# Patient Record
Sex: Male | Born: 1955 | Race: White | Hispanic: No | Marital: Married | Smoking: Former smoker
Health system: Southern US, Community
[De-identification: ages and names within clinical notes are randomized; demographics above are authoritative.]

## PROBLEM LIST (undated history)

## (undated) DIAGNOSIS — C33 Malignant neoplasm of trachea: Secondary | ICD-10-CM

## (undated) DIAGNOSIS — I2510 Atherosclerotic heart disease of native coronary artery without angina pectoris: Secondary | ICD-10-CM

## (undated) DIAGNOSIS — E039 Hypothyroidism, unspecified: Secondary | ICD-10-CM

## (undated) DIAGNOSIS — E079 Disorder of thyroid, unspecified: Secondary | ICD-10-CM

## (undated) DIAGNOSIS — R627 Adult failure to thrive: Secondary | ICD-10-CM

## (undated) DIAGNOSIS — Z9289 Personal history of other medical treatment: Secondary | ICD-10-CM

## (undated) DIAGNOSIS — E785 Hyperlipidemia, unspecified: Secondary | ICD-10-CM

## (undated) HISTORY — DX: Atherosclerotic heart disease of native coronary artery without angina pectoris: I25.10

## (undated) HISTORY — DX: Hypothyroidism, unspecified: E03.9

## (undated) HISTORY — PX: GASTROSTOMY TUBE PLACEMENT: SHX655

## (undated) HISTORY — DX: Personal history of other medical treatment: Z92.89

## (undated) HISTORY — DX: Hyperlipidemia, unspecified: E78.5

---

## 2006-04-02 ENCOUNTER — Emergency Department: Payer: Self-pay | Admitting: Emergency Medicine

## 2006-04-09 ENCOUNTER — Emergency Department: Payer: Self-pay | Admitting: Emergency Medicine

## 2006-07-28 ENCOUNTER — Emergency Department: Payer: Self-pay | Admitting: Emergency Medicine

## 2012-12-01 ENCOUNTER — Emergency Department: Payer: Self-pay | Admitting: Emergency Medicine

## 2013-08-06 ENCOUNTER — Emergency Department: Payer: Self-pay | Admitting: Emergency Medicine

## 2013-08-06 LAB — COMPREHENSIVE METABOLIC PANEL
ALT: 37 U/L (ref 12–78)
ANION GAP: 7 (ref 7–16)
Albumin: 4 g/dL (ref 3.4–5.0)
Alkaline Phosphatase: 108 U/L
BUN: 20 mg/dL — ABNORMAL HIGH (ref 7–18)
Bilirubin,Total: 0.5 mg/dL (ref 0.2–1.0)
Calcium, Total: 8.4 mg/dL — ABNORMAL LOW (ref 8.5–10.1)
Chloride: 106 mmol/L (ref 98–107)
Co2: 28 mmol/L (ref 21–32)
Creatinine: 1.4 mg/dL — ABNORMAL HIGH (ref 0.60–1.30)
GFR CALC NON AF AMER: 55 — AB
GLUCOSE: 117 mg/dL — AB (ref 65–99)
OSMOLALITY: 285 (ref 275–301)
Potassium: 3.9 mmol/L (ref 3.5–5.1)
SGOT(AST): 30 U/L (ref 15–37)
SODIUM: 141 mmol/L (ref 136–145)
Total Protein: 7.1 g/dL (ref 6.4–8.2)

## 2013-08-06 LAB — CBC
HCT: 46.1 % (ref 40.0–52.0)
HGB: 15.7 g/dL (ref 13.0–18.0)
MCH: 33.7 pg (ref 26.0–34.0)
MCHC: 34.1 g/dL (ref 32.0–36.0)
MCV: 99 fL (ref 80–100)
PLATELETS: 259 10*3/uL (ref 150–440)
RBC: 4.66 10*6/uL (ref 4.40–5.90)
RDW: 13.8 % (ref 11.5–14.5)
WBC: 16.3 10*3/uL — ABNORMAL HIGH (ref 3.8–10.6)

## 2013-08-06 LAB — URINALYSIS, COMPLETE
Bacteria: NONE SEEN
Bilirubin,UR: NEGATIVE
Glucose,UR: NEGATIVE mg/dL (ref 0–75)
Ketone: NEGATIVE
LEUKOCYTE ESTERASE: NEGATIVE
Nitrite: NEGATIVE
Ph: 5 (ref 4.5–8.0)
Protein: 30
SPECIFIC GRAVITY: 1.032 (ref 1.003–1.030)
Squamous Epithelial: NONE SEEN
WBC UR: 2 /HPF (ref 0–5)

## 2015-10-19 ENCOUNTER — Encounter: Payer: Self-pay | Admitting: Emergency Medicine

## 2015-10-19 ENCOUNTER — Emergency Department
Admission: EM | Admit: 2015-10-19 | Discharge: 2015-10-19 | Disposition: A | Discharge status: Home / Self Care | Payer: Self-pay | Attending: Emergency Medicine | Admitting: Emergency Medicine

## 2015-10-19 ENCOUNTER — Emergency Department: Payer: Self-pay

## 2015-10-19 DIAGNOSIS — R252 Cramp and spasm: Secondary | ICD-10-CM | POA: Insufficient documentation

## 2015-10-19 DIAGNOSIS — F17200 Nicotine dependence, unspecified, uncomplicated: Secondary | ICD-10-CM | POA: Insufficient documentation

## 2015-10-19 LAB — BASIC METABOLIC PANEL
Anion gap: 7 (ref 5–15)
BUN: 30 mg/dL — AB (ref 6–20)
CALCIUM: 8.9 mg/dL (ref 8.9–10.3)
CO2: 26 mmol/L (ref 22–32)
CREATININE: 0.85 mg/dL (ref 0.61–1.24)
Chloride: 105 mmol/L (ref 101–111)
GFR calc non Af Amer: >60 mL/min (ref >60)
Glucose, Bld: 140 mg/dL — ABNORMAL HIGH (ref 65–99)
Potassium: 3.8 mmol/L (ref 3.5–5.1)
SODIUM: 138 mmol/L (ref 135–145)

## 2015-10-19 LAB — CBC WITH DIFFERENTIAL/PLATELET
Basophils Absolute: 0.1 10*3/uL (ref 0–0.1)
Basophils Relative: 1 %
Eosinophils Absolute: 0.2 10*3/uL (ref 0–0.7)
Eosinophils Relative: 2 %
HEMATOCRIT: 44.4 % (ref 40.0–52.0)
HEMOGLOBIN: 15.3 g/dL (ref 13.0–18.0)
LYMPHS ABS: 2 10*3/uL (ref 1.0–3.6)
MCH: 33.6 pg (ref 26.0–34.0)
MCHC: 34.5 g/dL (ref 32.0–36.0)
MCV: 97.4 fL (ref 80.0–100.0)
Monocytes Absolute: 0.7 10*3/uL (ref 0.2–1.0)
NEUTROS ABS: 6.8 10*3/uL — AB (ref 1.4–6.5)
Platelets: 292 10*3/uL (ref 150–440)
RBC: 4.56 MIL/uL (ref 4.40–5.90)
RDW: 13.5 % (ref 11.5–14.5)
WBC: 9.7 10*3/uL (ref 3.8–10.6)

## 2015-10-19 MED ORDER — NAPROXEN 500 MG PO TABS: 500.0000 mg | ORAL_TABLET | Freq: Two times a day (BID) | ORAL | Status: DC

## 2015-10-19 MED ORDER — CYCLOBENZAPRINE HCL 10 MG PO TABS: 10.0000 mg | ORAL_TABLET | Freq: Three times a day (TID) | ORAL | Status: DC | PRN

## 2015-10-19 NOTE — Discharge Instructions (Signed)
Muscle Cramps and Spasms Muscle cramps and spasms occur when a muscle or muscles tighten and you have no control over this tightening (involuntary muscle contraction). They are a common problem and can develop in any muscle. The most common place is in the calf muscles of the leg. Both muscle cramps and muscle spasms are involuntary muscle contractions, but they also have differences:   Muscle cramps are sporadic and painful. They may last a few seconds to a quarter of an hour. Muscle cramps are often more forceful and last longer than muscle spasms.  Muscle spasms may or may not be painful. They may also last just a few seconds or much longer. CAUSES  It is uncommon for cramps or spasms to be due to a serious underlying problem. In many cases, the cause of cramps or spasms is unknown. Some common causes are:   Overexertion.   Overuse from repetitive motions (doing the same thing over and over).   Remaining in a certain position for a long period of time.   Improper preparation, form, or technique while performing a sport or activity.   Dehydration.   Injury.   Side effects of some medicines.   Abnormally low levels of the salts and ions in your blood (electrolytes), especially potassium and calcium. This could happen if you are taking water pills (diuretics) or you are pregnant.  Some underlying medical problems can make it more likely to develop cramps or spasms. These include, but are not limited to:   Diabetes.   Parkinson disease.   Hormone disorders, such as thyroid problems.   Alcohol abuse.   Diseases specific to muscles, joints, and bones.   Blood vessel disease where not enough blood is getting to the muscles.  HOME CARE INSTRUCTIONS   Stay well hydrated. Drink enough water and fluids to keep your urine clear or pale yellow.  It may be helpful to massage, stretch, and relax the affected muscle.  For tight or tense muscles, use a warm towel, heating  pad, or hot shower water directed to the affected area.  If you are sore or have pain after a cramp or spasm, applying ice to the affected area may relieve discomfort.  Put ice in a plastic bag.  Place a towel between your skin and the bag.  Leave the ice on for 15-20 minutes, 03-04 times a day.  Medicines used to treat a known cause of cramps or spasms may help reduce their frequency or severity. Only take over-the-counter or prescription medicines as directed by your caregiver. SEEK MEDICAL CARE IF:  Your cramps or spasms get more severe, more frequent, or do not improve over time.  MAKE SURE YOU:   Understand these instructions.  Will watch your condition.  Will get help right away if you are not doing well or get worse.   This information is not intended to replace advice given to you by your health care provider. Make sure you discuss any questions you have with your health care provider.   Document Released: 11/11/2001 Document Revised: 09/16/2012 Document Reviewed: 05/08/2012 Elsevier Interactive Patient Education 2016 [employer]  Leg Cramps Leg cramps occur when a muscle or muscles tighten and you have no control over this tightening (involuntary muscle contraction). Muscle cramps can develop in any muscle, but the most common place is in the calf muscles of the leg. Those cramps can occur during exercise or when you are at rest. Leg cramps are painful, and they may last for a  few seconds to a few minutes. Cramps may return several times before they finally stop. Usually, leg cramps are not caused by a serious medical problem. In many cases, the cause is not known. Some common causes include:  Overexertion.  Overuse from repetitive motions, or doing the same thing over and over.  Remaining in a certain position for a long period of time.  Improper preparation, form, or technique while performing a sport or an activity.  Dehydration.  Injury.  Side effects of some  medicines.  Abnormally low levels of the salts and ions in your blood (electrolytes), especially potassium and calcium. These levels could be low if you are taking water pills (diuretics) or if you are pregnant. HOME CARE INSTRUCTIONS Watch your condition for any changes. Taking the following actions may help to lessen any discomfort that you are feeling:  Stay well-hydrated. Drink enough fluid to keep your urine clear or pale yellow.  Try massaging, stretching, and relaxing the affected muscle. Do this for several minutes at a time.  For tight or tense muscles, use a warm towel, heating pad, or hot shower water directed to the affected area.  If you are sore or have pain after a cramp, applying ice to the affected area may relieve discomfort.  Put ice in a plastic bag.  Place a towel between your skin and the bag.  Leave the ice on for 20 minutes, 2-3 times per day.  Avoid strenuous exercise for several days if you have been having frequent leg cramps.  Make sure that your diet includes the essential minerals for your muscles to work normally.  Take medicines only as directed by your health care provider. SEEK MEDICAL CARE IF:  Your leg cramps get more severe or more frequent, or they do not improve over time.  Your foot becomes cold, numb, or blue.   This information is not intended to replace advice given to you by your health care provider. Make sure you discuss any questions you have with your health care provider.   Document Released: 06/29/2004 Document Revised: 10/06/2014 Document Reviewed: 04/29/2014 Elsevier Interactive Patient Education [employer].

## 2015-10-19 NOTE — ED Notes (Addendum)
Pt to ed with c/o left knee and lower leg pain x 1 1/2 weeks.  Pt states cramping last week in left lower leg.  Pt reports increased pain at night. No swelling noted to lower leg.

## 2015-10-19 NOTE — ED Notes (Signed)
See triage note   Denies any injury  But has been having pain for about 1-2 weeks   Pain increases at night

## 2015-10-19 NOTE — ED Provider Notes (Signed)
[facility] Emergency Department Provider Note  ____________________________________________  Time seen: Approximately 2:52 PM  I have reviewed the triage vital signs and the nursing notes.   HISTORY  Chief Complaint Knee Pain    HPI Matthew Peters is a 60 y.o. male , NAD, presents emergency Department with 2 week history of left lower leg pain. States he gets cramps at night which seemed to worsen his pain. Cannot pinpoint any specific place in which she feels pain. Pain is from his left knee down through his left ankle. Has not noted any redness, swelling, warmth. No skin sores or open wounds. Denies any injuries, traumas, falls. Has not had any numbness, weakness, tingling. Has taken a couple of aspirin for the pain but states it does not help. Denies chest pain, shortness of breath, wheezing, visual changes nor headaches.   History reviewed. No pertinent past medical history.  There are no active problems to display for this patient.   History reviewed. No pertinent past surgical history.  Current Outpatient Rx  Name  Route  Sig  Dispense  Refill  . cyclobenzaprine (FLEXERIL) 10 MG tablet   Oral   Take 1 tablet (10 mg total) by mouth 3 (three) times daily as needed for muscle spasms.   21 tablet   0   . naproxen (NAPROSYN) 500 MG tablet   Oral   Take 1 tablet (500 mg total) by mouth 2 (two) times daily with a meal.   14 tablet   0     Allergies Sulfa antibiotics  History reviewed. No pertinent family history.  Social History Social History  Substance Use Topics  . Smoking status: Current Every Day Smoker  . Smokeless tobacco: None  . Alcohol Use: No     Review of Systems  Constitutional: No fever/chills, fatigue Eyes: No visual changes. Cardiovascular: No chest pain, palpitations. Respiratory: No shortness of breath. No wheezing.  Gastrointestinal: No abdominal pain.  No nausea, vomiting.  Musculoskeletal: Positive left knee pain  through the left ankle. Negative for back, hip pain.  Skin: Negative for rash, skin sores, swelling, redness, warmth. Neurological: Negative for headaches, focal weakness or numbness. No tingling. No dizziness. 10-point ROS otherwise negative.  ____________________________________________   PHYSICAL EXAM:  VITAL SIGNS: ED Triage Vitals  Enc Vitals Group     BP 10/19/15 1325 158/93 mmHg     Pulse Rate 10/19/15 1325 107     Resp 10/19/15 1325 20     Temp 10/19/15 1325 97.6 F (36.4 C)     Temp Source 10/19/15 1325 Oral     SpO2 10/19/15 1325 95 %     Weight 10/19/15 1325 180 lb (81.647 kg)     Height 10/19/15 1325 5\' 5"  (1.651 m)     Head Cir --      Peak Flow --      Pain Score 10/19/15 1326 4     Pain Loc --      Pain Edu? --      Excl. in [place]? --      Constitutional: Alert and oriented. Well appearing and in no acute distress. Eyes: Conjunctivae are normal.  Head: Atraumatic. Neck: Supple with full range of motion. Cardiovascular: Normal rate, regular rhythm. Normal S1 and S2.  Good peripheral circulation with 2+ pulses noted in the left lower extremity. Capillary refill is brisk in the left lower extremity. Respiratory: Normal respiratory effort without tachypnea or retractions. Lungs CTAB with breath sounds noted in all lung  fields. Musculoskeletal: Full range of motion of the left knee, ankle, foot, toes without pain. No lower extremity tenderness nor edema.  No joint effusions. Neurologic:  Normal speech and language. No gross focal neurologic deficits are appreciated. Gait and posture is normal. CN III-XII grossly in tact. Sensation to light touch is grossly normal in the left lower extremity. Skin:  Skin is warm, dry and intact. No rash, redness, warmth, skin sores, open wounds noted. Psychiatric: Mood and affect are normal. Speech and behavior are normal. Patient exhibits appropriate insight and judgement.   ____________________________________________   LABS (all  labs ordered are listed, but only abnormal results are displayed)  Labs Reviewed  BASIC METABOLIC PANEL - Abnormal; Notable for the following:    Glucose, Bld 140 (*)    BUN 30 (*)    All other components within normal limits  CBC WITH DIFFERENTIAL/PLATELET - Abnormal; Notable for the following:    Neutro Abs 6.8 (*)    All other components within normal limits   ____________________________________________  EKG  None ____________________________________________  RADIOLOGY I have personally viewed and evaluated these images (plain radiographs) as part of my medical decision making, as well as reviewing the written report by the radiologist.  US Venous Img Lower Unilateral Left  10/19/2015  CLINICAL DATA:  60 year old male with a history of left knee and leg pain EXAM: LEFT LOWER EXTREMITY VENOUS DOPPLER ULTRASOUND TECHNIQUE: Gray-scale sonography with graded compression, as well as color Doppler and duplex ultrasound were performed to evaluate the lower extremity deep venous systems from the level of the common femoral vein and including the common femoral, femoral, profunda femoral, popliteal and calf veins including the posterior tibial, peroneal and gastrocnemius veins when visible. The superficial great saphenous vein was also interrogated. Spectral Doppler was utilized to evaluate flow at rest and with distal augmentation maneuvers in the common femoral, femoral and popliteal veins. COMPARISON:  None. FINDINGS: Contralateral Common Femoral Vein: Respiratory phasicity is normal and symmetric with the symptomatic side. No evidence of thrombus. Normal compressibility. Common Femoral Vein: No evidence of thrombus. Normal compressibility, respiratory phasicity and response to augmentation. Saphenofemoral Junction: No evidence of thrombus. Normal compressibility and flow on color Doppler imaging. Profunda Femoral Vein: No evidence of thrombus. Normal compressibility and flow on color Doppler  imaging. Femoral Vein: No evidence of thrombus. Normal compressibility, respiratory phasicity and response to augmentation. Popliteal Vein: No evidence of thrombus. Normal compressibility, respiratory phasicity and response to augmentation. Calf Veins: No evidence of thrombus. Normal compressibility and flow on color Doppler imaging. Superficial Great Saphenous Vein: No evidence of thrombus. Normal compressibility and flow on color Doppler imaging. Other Findings:  None. IMPRESSION: Sonographic survey of the left lower extremity negative for DVT. Signed, Dulcy Fanny. Earleen Newport, DO [facility] Radiology Electronically Signed   By: Corrie Mckusick D.O.   On: 10/19/2015 14:41   Dg Knee Complete 4 Views Left  10/19/2015  CLINICAL DATA:  Left knee pain without known injury. EXAM: LEFT KNEE - COMPLETE 4+ VIEW COMPARISON:  None. FINDINGS: There is no evidence of fracture, dislocation, or joint effusion. There is no evidence of arthropathy or other focal bone abnormality. Soft tissues are unremarkable. IMPRESSION: Normal left knee. Electronically Signed   By: Marijo Conception, M.D.   On: 10/19/2015 14:17    ____________________________________________    PROCEDURES  Procedure(s) performed: None    Medications - No data to display   ____________________________________________   INITIAL IMPRESSION / ASSESSMENT AND PLAN /  ED COURSE  Pertinent labs & imaging results that were available during my care of the patient were reviewed by me and considered in my medical decision making (see chart for details).  Patient's diagnosis is consistent with nocturnal muscle cramps causing musculoskeletal pain. Patient will be discharged home with prescriptions for Flexeril and Naprosyn to take as directed. Patient is advised to complete stretching exercises at night prior to bed. Patient is to follow up with [facility] if symptoms persist past this treatment  course. Patient is given ED precautions to return to the ED for any worsening or new symptoms.      ____________________________________________  FINAL CLINICAL IMPRESSION(S) / ED DIAGNOSES  Final diagnoses:  Muscle cramp, nocturnal      NEW MEDICATIONS STARTED DURING THIS VISIT:  New Prescriptions   CYCLOBENZAPRINE (FLEXERIL) 10 MG TABLET    Take 1 tablet (10 mg total) by mouth 3 (three) times daily as needed for muscle spasms.   NAPROXEN (NAPROSYN) 500 MG TABLET    Take 1 tablet (500 mg total) by mouth 2 (two) times daily with a meal.         Mariel Lukins L Aluna Whiston, PA-C 10/19/15 1504

## 2016-06-29 ENCOUNTER — Encounter: Payer: Self-pay | Admitting: Emergency Medicine

## 2016-06-29 ENCOUNTER — Emergency Department: Payer: Self-pay

## 2016-06-29 ENCOUNTER — Emergency Department
Admission: EM | Admit: 2016-06-29 | Discharge: 2016-06-29 | Disposition: A | Discharge status: Home / Self Care | Payer: Self-pay | Attending: Emergency Medicine | Admitting: Emergency Medicine

## 2016-06-29 DIAGNOSIS — F17200 Nicotine dependence, unspecified, uncomplicated: Secondary | ICD-10-CM | POA: Insufficient documentation

## 2016-06-29 DIAGNOSIS — K047 Periapical abscess without sinus: Secondary | ICD-10-CM | POA: Insufficient documentation

## 2016-06-29 DIAGNOSIS — L03211 Cellulitis of face: Secondary | ICD-10-CM | POA: Insufficient documentation

## 2016-06-29 LAB — CBC WITH DIFFERENTIAL/PLATELET
Basophils Absolute: 0.1 10*3/uL (ref 0–0.1)
Basophils Relative: 1 %
EOS ABS: 0.2 10*3/uL (ref 0–0.7)
EOS PCT: 1 %
HCT: 44.9 % (ref 40.0–52.0)
Hemoglobin: 15.7 g/dL (ref 13.0–18.0)
LYMPHS PCT: 15 %
Lymphs Abs: 2 10*3/uL (ref 1.0–3.6)
MCH: 33.8 pg (ref 26.0–34.0)
MCHC: 35 g/dL (ref 32.0–36.0)
MCV: 96.6 fL (ref 80.0–100.0)
MONO ABS: 1 10*3/uL (ref 0.2–1.0)
Monocytes Relative: 8 %
Neutro Abs: 10.3 10*3/uL — ABNORMAL HIGH (ref 1.4–6.5)
Neutrophils Relative %: 75 %
PLATELETS: 291 10*3/uL (ref 150–440)
RBC: 4.65 MIL/uL (ref 4.40–5.90)
RDW: 13.6 % (ref 11.5–14.5)
WBC: 13.6 10*3/uL — ABNORMAL HIGH (ref 3.8–10.6)

## 2016-06-29 LAB — BASIC METABOLIC PANEL
Anion gap: 8 (ref 5–15)
BUN: 23 mg/dL — AB (ref 6–20)
CALCIUM: 8.7 mg/dL — AB (ref 8.9–10.3)
CO2: 25 mmol/L (ref 22–32)
Chloride: 105 mmol/L (ref 101–111)
Creatinine, Ser: 1.02 mg/dL (ref 0.61–1.24)
GFR calc Af Amer: >60 mL/min (ref >60)
GLUCOSE: 106 mg/dL — AB (ref 65–99)
Potassium: 4.1 mmol/L (ref 3.5–5.1)
SODIUM: 138 mmol/L (ref 135–145)

## 2016-06-29 MED ORDER — IOPAMIDOL (ISOVUE-300) INJECTION 61%
75.0000 mL | Freq: Once | INTRAVENOUS | Status: AC | PRN
  Administered 2016-06-29: 75 mL via INTRAVENOUS
  Filled 2016-06-29: qty 75

## 2016-06-29 MED ORDER — CLINDAMYCIN HCL 150 MG PO CAPS: 300.0000 mg | ORAL_CAPSULE | Freq: Three times a day (TID) | ORAL | 0 refills | Status: AC

## 2016-06-29 MED ORDER — SODIUM CHLORIDE 0.9 % IV BOLUS (SEPSIS)
1000.0000 mL | Freq: Once | INTRAVENOUS | Status: AC
  Administered 2016-06-29: 1000 mL via INTRAVENOUS

## 2016-06-29 MED ORDER — CLINDAMYCIN PHOSPHATE 600 MG/50ML IV SOLN
600.0000 mg | Freq: Once | INTRAVENOUS | Status: AC
  Administered 2016-06-29: 600 mg via INTRAVENOUS
  Filled 2016-06-29: qty 50

## 2016-06-29 NOTE — ED Provider Notes (Signed)
[facility] Emergency Department Provider Note  ____________________________________________  Time seen: Approximately 8:14 PM  I have reviewed the triage vital signs and the nursing notes.   HISTORY  Chief Complaint Facial Pain   HPI Matthew Peters is a 61 y.o. male who presents to the emergency department for evaluation of facial swelling. Symptoms started 3 days ago and have gradually gotten worse. He denies dental pain, but states he has a loose tooth. He isn't sure if he's had a fever or not, but admits to some chills last night. He has not taken any medications for this complaint.  History reviewed. No pertinent past medical history.  There are no active problems to display for this patient.   History reviewed. No pertinent surgical history.  Prior to Admission medications   Medication Sig Start Date End Date Taking? Authorizing Provider  clindamycin (CLEOCIN) 150 MG capsule Take 2 capsules (300 mg total) by mouth 3 (three) times daily. 06/29/16 07/09/16  Victorino Dike, FNP  cyclobenzaprine (FLEXERIL) 10 MG tablet Take 1 tablet (10 mg total) by mouth 3 (three) times daily as needed for muscle spasms. 10/19/15   Jami L Hagler, PA-C  naproxen (NAPROSYN) 500 MG tablet Take 1 tablet (500 mg total) by mouth 2 (two) times daily with a meal. 10/19/15   Jami L Hagler, PA-C    Allergies Sulfa antibiotics  No family history on file.  Social History Social History  Substance Use Topics  . Smoking status: Current Every Day Smoker  . Smokeless tobacco: Never Used  . Alcohol use No    Review of Systems  Constitutional: Negative for fever/ Positive for chills Respiratory: Negative for shortness of breath. Musculoskeletal: Negative for pain. Skin: Positive for facial swelling Neurological: Negative for headaches, focal weakness or numbness. ____________________________________________   PHYSICAL EXAM:  VITAL SIGNS: ED Triage Vitals  Enc Vitals Group      BP 06/29/16 1949 (!) 170/93     Pulse Rate 06/29/16 1949 84     Resp 06/29/16 1949 18     Temp 06/29/16 1949 98.9 F (37.2 C)     Temp Source 06/29/16 1949 Oral     SpO2 06/29/16 1949 96 %     Weight 06/29/16 1949 180 lb (81.6 kg)     Height 06/29/16 1949 5\' 7"  (1.702 m)     Head Circumference --      Peak Flow --      Pain Score 06/29/16 2008 5     Pain Loc --      Pain Edu? --      Excl. in [place]? --      Constitutional: Alert and oriented. Well appearing and in no acute distress. Eyes: Conjunctivae are normal. EOMI. Nose: No congestion/rhinnorhea. Mouth/Throat: Right buccal surface is indurated and fluctuant, Tooth #27 loose with copious amount of purulent drainage from the socket.   Neck: No stridor. Lymphatic: No cervical lymphadenopathy. Cardiovascular: Good peripheral circulation. Respiratory: Normal respiratory effort.  No retractions. Lungs diminished throughout, otherwise clear. Musculoskeletal: FROM throughout. Neurologic:  Normal speech and language. No gross focal neurologic deficits are appreciated. Skin:  Right side of face extremely swollen without erythema-- see mouth exam.  ____________________________________________   LABS (all labs ordered are listed, but only abnormal results are displayed)  Labs Reviewed  CBC WITH DIFFERENTIAL/PLATELET - Abnormal; Notable for the following:       Result Value   WBC 13.6 (*)    Neutro Abs 10.3 (*)  All other components within normal limits  BASIC METABOLIC PANEL - Abnormal; Notable for the following:    Glucose, Bld 106 (*)    BUN 23 (*)    Calcium 8.7 (*)    All other components within normal limits   ____________________________________________  EKG   ____________________________________________  RADIOLOGY CT Maxillofacial:  IMPRESSION:  Soft tissues swelling with inflammatory stranding within the right  masticator and submandibular spaces, concerning for infection/  cellulitis. Superimposed  ill-defined 22 x 9 x 7 mm area of irregular  hypodensities within this region concerning for phlegmon/early  abscess. Changes are most likely odontogenic in origin, with  prominent dental caries present within the adjacent right mandible    ____________________________________________   PROCEDURES  Procedure(s) performed: None ____________________________________________   INITIAL IMPRESSION / ASSESSMENT AND PLAN / ED COURSE     Pertinent labs & imaging results that were available during my care of the patient were reviewed by me and considered in my medical decision making (see chart for details).  61 year old male presenting to the emergency department for facial swelling likely odontogenic in nature. Continuous drainage from the socket of tooth #27 which correlates with the facial swelling. While in the emergency department, he was given Clindamycin. He will receive a prescription for the same. He was given strict return precautions and advised that he must follow up with the dentist. He was given a list of dental resources. ____________________________________________   FINAL CLINICAL IMPRESSION(S) / ED DIAGNOSES  Final diagnoses:  Facial cellulitis  Dental abscess    Discharge Medication List as of 06/29/2016 11:26 PM    START taking these medications   Details  clindamycin (CLEOCIN) 150 MG capsule Take 2 capsules (300 mg total) by mouth 3 (three) times daily., Starting Thu 06/29/2016, Until Sun 07/09/2016, Print        Note:  This document was prepared using Dragon voice recognition software and may include unintentional dictation errors.    Victorino Dike, FNP 07/02/16 Smith Yao, MD 07/02/16 2054

## 2016-06-29 NOTE — ED Notes (Signed)
Patient returned from CT

## 2016-06-29 NOTE — ED Triage Notes (Signed)
Patient comes in from home with right side facial pain. Denies any tooth pain. Right lower jaw is swelled. Patient a & ox4. Air way is clear. Able to speak in complete sentences.

## 2016-08-11 IMAGING — US US EXTREM LOW VENOUS*L*
1 series · 13 of 24 positions shown · non-contrast
Comparison: None.

CLINICAL DATA: 59-year-old male with a history of left knee and leg
pain



[Series 1: us extrem low venous*left* · 0.07mm/px · 13 of 33 slices shown]
[im 1/33]
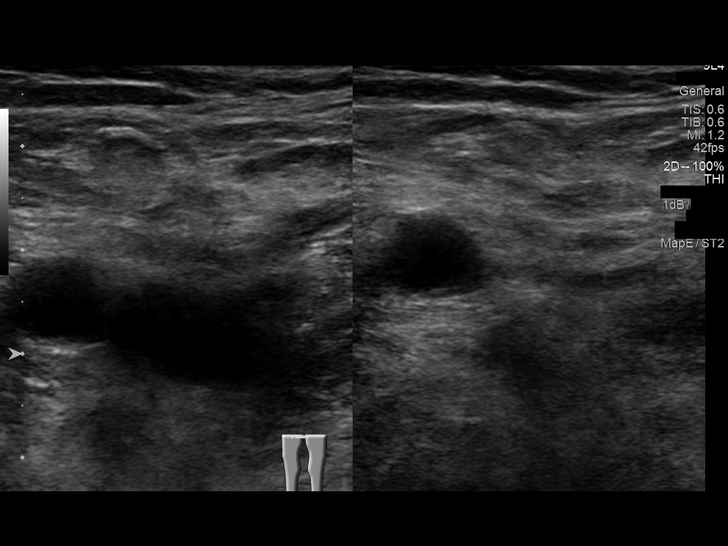
[im 3/33]
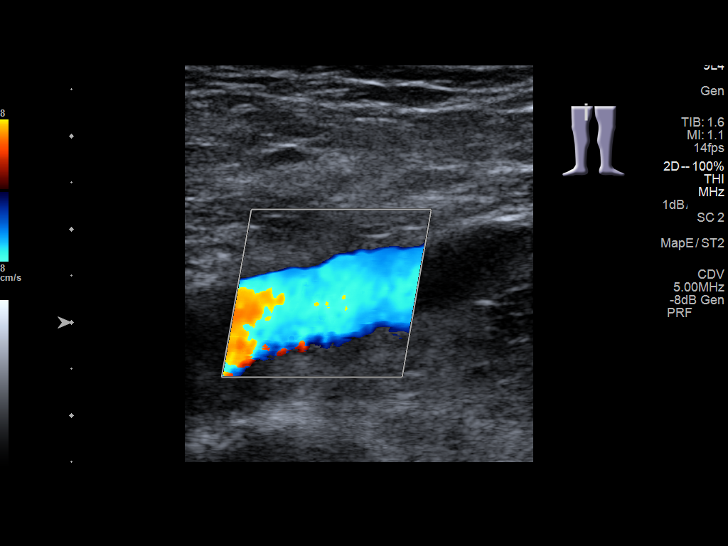
[im 6/33]
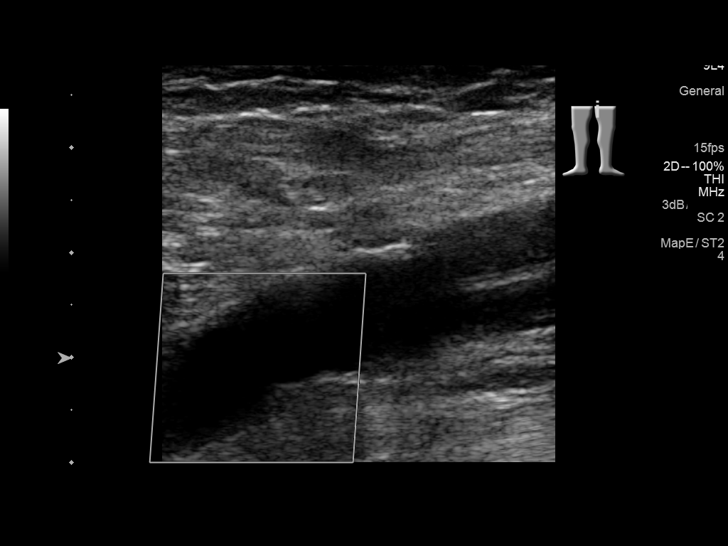
[im 9/33]
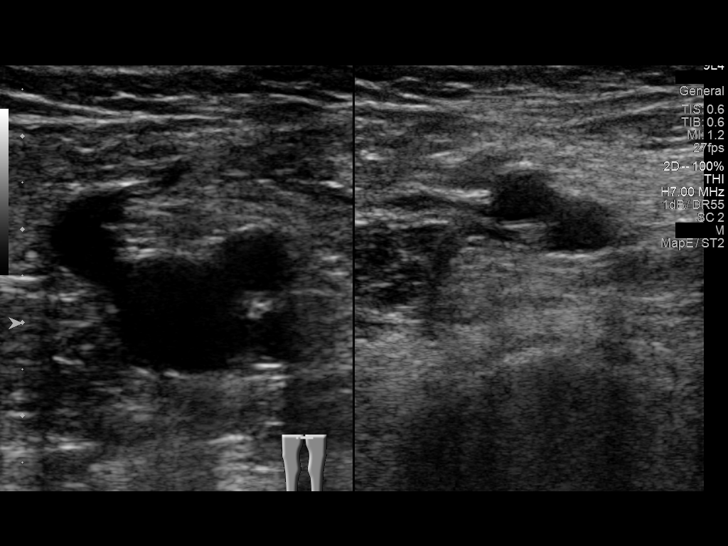
[im 12/33]
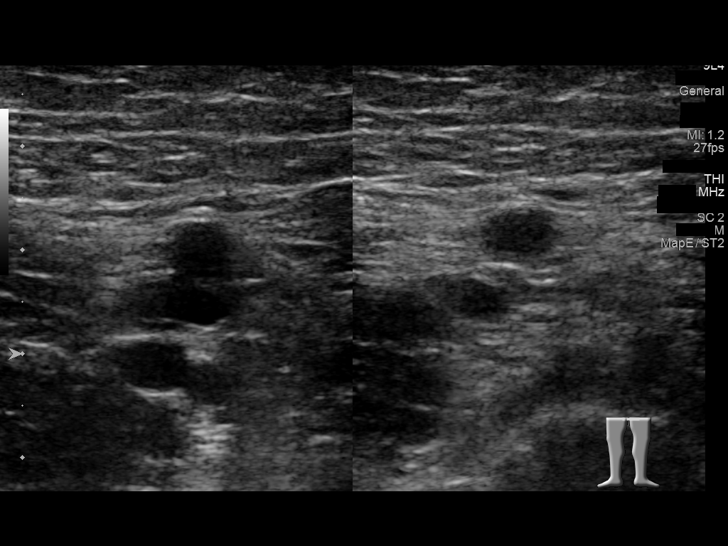
[im 14/33]
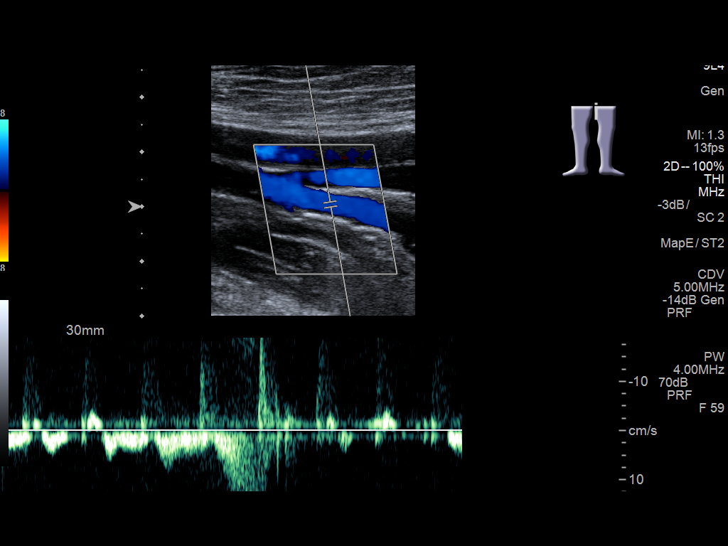
[im 17/33]
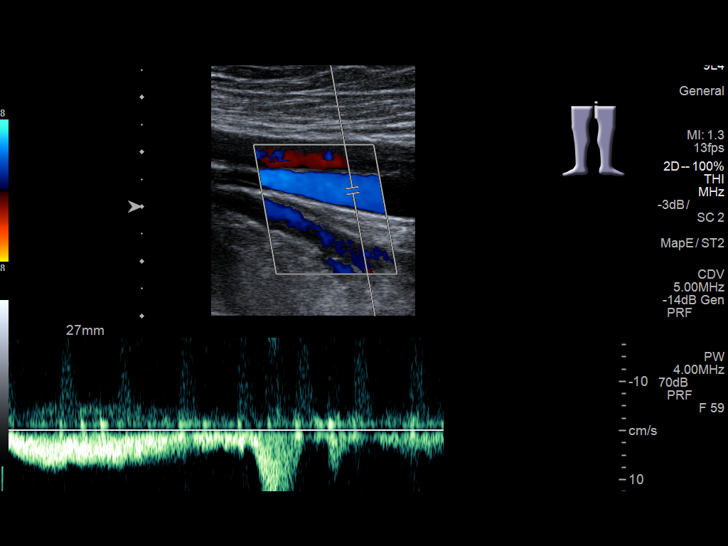
[im 19/33]
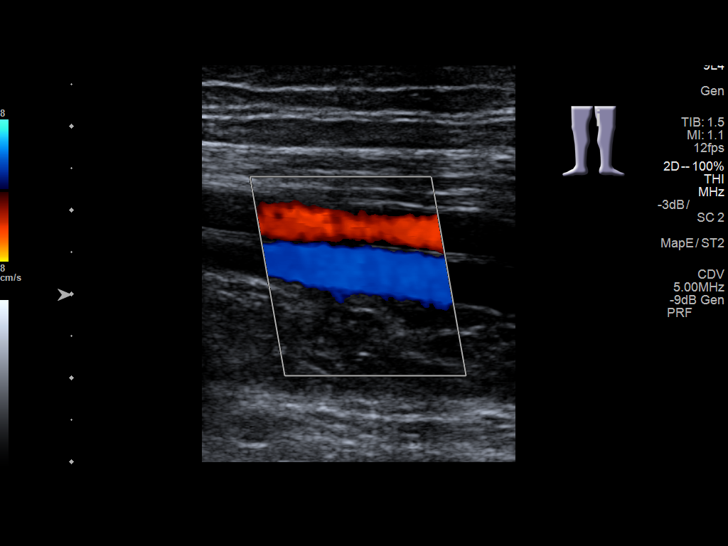
[im 21/33]
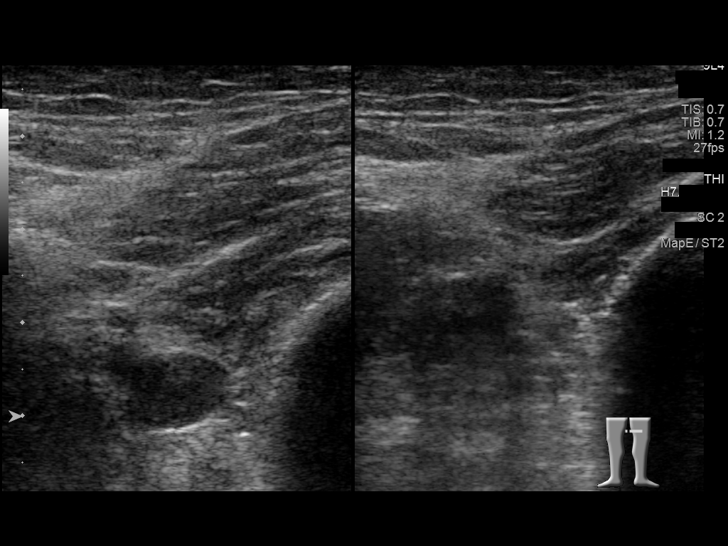
[im 24/33]
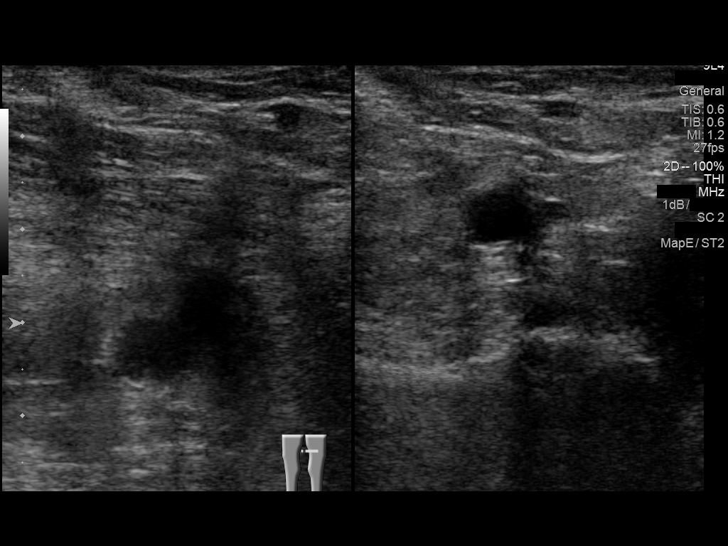
[im 27/33]
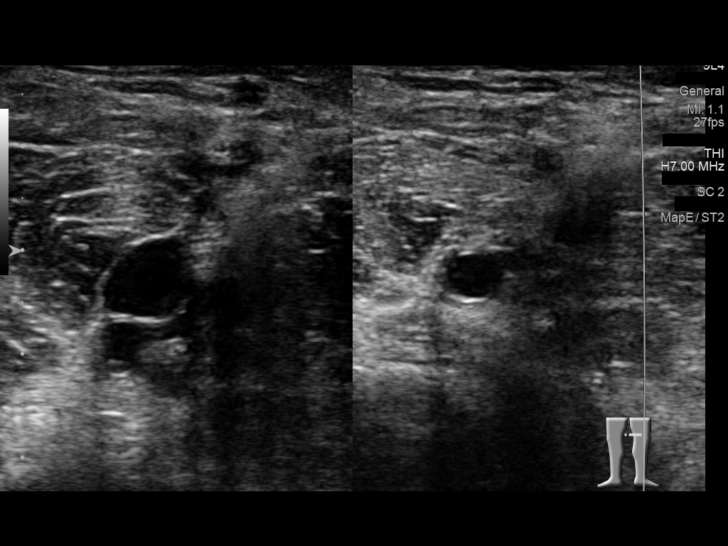
[im 30/33]
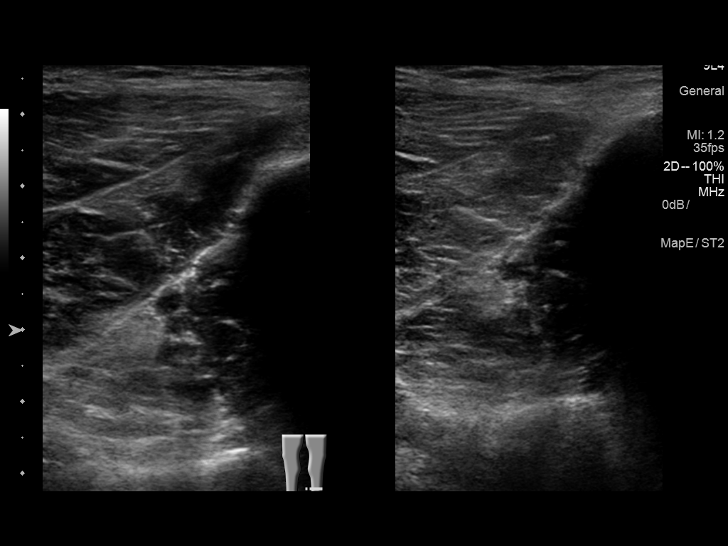
[im 33/33]
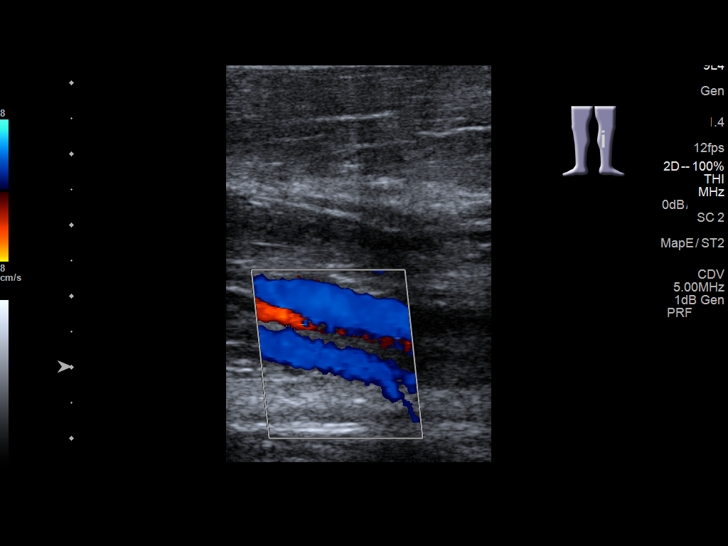

[13 of 24 positions shown; findings below may reference images not displayed]

FINDINGS: Contralateral Common Femoral Vein: Respiratory phasicity is normal
and symmetric with the symptomatic side. No evidence of thrombus.
Normal compressibility.

Common Femoral Vein: No evidence of thrombus. Normal
compressibility, respiratory phasicity and response to augmentation.

Saphenofemoral Junction: No evidence of thrombus. Normal
compressibility and flow on color Doppler imaging.

Profunda Femoral Vein: No evidence of thrombus. Normal
compressibility and flow on color Doppler imaging.

Femoral Vein: No evidence of thrombus. Normal compressibility,
respiratory phasicity and response to augmentation.

Popliteal Vein: No evidence of thrombus. Normal compressibility,
respiratory phasicity and response to augmentation.

Calf Veins: No evidence of thrombus. Normal compressibility and flow
on color Doppler imaging.

Superficial Great Saphenous Vein: No evidence of thrombus. Normal
compressibility and flow on color Doppler imaging.

Other Findings:  None.
IMPRESSION: Sonographic survey of the left lower extremity negative for DVT.

## 2017-10-21 DIAGNOSIS — C760 Malignant neoplasm of head, face and neck: Secondary | ICD-10-CM | POA: Insufficient documentation

## 2017-10-21 DIAGNOSIS — C4442 Squamous cell carcinoma of skin of scalp and neck: Secondary | ICD-10-CM | POA: Insufficient documentation

## 2017-10-26 DIAGNOSIS — R634 Abnormal weight loss: Secondary | ICD-10-CM | POA: Insufficient documentation

## 2017-11-13 DIAGNOSIS — K5903 Drug induced constipation: Secondary | ICD-10-CM | POA: Insufficient documentation

## 2018-04-06 ENCOUNTER — Emergency Department: Payer: Self-pay

## 2018-04-06 ENCOUNTER — Emergency Department
Admission: EM | Admit: 2018-04-06 | Discharge: 2018-04-06 | Disposition: A | Discharge status: Home / Self Care | Payer: Self-pay | Attending: Emergency Medicine | Admitting: Emergency Medicine

## 2018-04-06 ENCOUNTER — Other Ambulatory Visit: Payer: Self-pay

## 2018-04-06 DIAGNOSIS — F17200 Nicotine dependence, unspecified, uncomplicated: Secondary | ICD-10-CM | POA: Insufficient documentation

## 2018-04-06 DIAGNOSIS — K9429 Other complications of gastrostomy: Secondary | ICD-10-CM | POA: Insufficient documentation

## 2018-04-06 DIAGNOSIS — K9420 Gastrostomy complication, unspecified: Secondary | ICD-10-CM

## 2018-04-06 HISTORY — DX: Malignant neoplasm of trachea: C33

## 2018-04-06 MED ORDER — DIATRIZOATE MEGLUMINE & SODIUM 66-10 % PO SOLN
60.0000 mL | Freq: Once | ORAL | Status: AC
  Administered 2018-04-06: 60 mL

## 2018-04-06 NOTE — ED Triage Notes (Addendum)
Pt states feeding tube came out this AM. Has feeding tube with pt. A&O. Ambulatory. No distress noted. Has never came out before. Family states it looks like bulb deflated. Had feeding tube since June. Site appears a little swollen, draining at this time. Has a bandage covering site to decrease irritation from shirt.

## 2018-04-06 NOTE — Discharge Instructions (Addendum)
Return for any problem with the feeding tube.

## 2018-04-06 NOTE — ED Triage Notes (Signed)
First Nurse Note:  C/O feeding tube coming out this morning.  AAOx3.  Skin warm and dry. NAD

## 2018-04-06 NOTE — ED Provider Notes (Addendum)
[facility] Emergency Department Provider Note ____________________________________________   I have reviewed the triage vital signs and the triage nursing note.  HISTORY  Chief Complaint feeding tube out   Historian Patient  HPI Matthew Peters is a 62 y.o. male presents with feeding tube out.  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  We will try to use it this morning and was not working and then it came all the way out.  No previous history of trouble with the G-tube.  No abdominal pain.  No nausea or vomiting.     Past Medical History:  Diagnosis Date  . Squamous cell carcinoma of trachea (HCC)     There are no active problems to display for this patient.   Past Surgical History:  Procedure Laterality Date  . GASTROSTOMY TUBE PLACEMENT      Prior to Admission medications   Medication Sig Start Date End Date Taking? Authorizing Provider  cyclobenzaprine (FLEXERIL) 10 MG tablet Take 1 tablet (10 mg total) by mouth 3 (three) times daily as needed for muscle spasms. 10/19/15   Hagler, Jami L, PA-C  naproxen (NAPROSYN) 500 MG tablet Take 1 tablet (500 mg total) by mouth 2 (two) times daily with a meal. 10/19/15   Hagler, Jami L, PA-C    Allergies  Allergen Reactions  . Sulfa Antibiotics Anaphylaxis    History reviewed. No pertinent family history.  Social History Social History   Tobacco Use  . Smoking status: Current Every Day Smoker  . Smokeless tobacco: Never Used  Substance Use Topics  . Alcohol use: No  . Drug use: No    Review of Systems  No abdominal pain.  No vomiting.  No chest pain or trouble breathing.  No recent illnesses.  No skin rash.  ____________________________________________   PHYSICAL EXAM:  VITAL SIGNS: ED Triage Vitals [04/06/18 1131]  Enc Vitals Group     BP (!) 136/92     Pulse Rate 99     Resp 18     Temp 98.1 F (36.7 C)     Temp Source Oral   SpO2 97 %     Weight 124 lb (56.2 kg)     Height 5\' 7"  (1.702 m)     Head Circumference      Peak Flow      Pain Score 0     Pain Loc      Pain Edu?      Excl. in [place]?      Constitutional: Alert and oriented.  HEENT      Head: Normocephalic and atraumatic.      Eyes: Conjunctivae are normal. Pupils equal and round.       Ears:         Nose: No congestion/rhinnorhea.      Mouth/Throat: Mucous membranes are moist.      Neck: No stridor. Cardiovascular/Chest: Normal rate, regular rhythm.  No murmurs, rubs, or gallops. Respiratory: Normal respiratory effort without tachypnea nor retractions. Breath sounds are clear and equal bilaterally. No wheezes/rales/rhonchi. Gastrointestinal: Soft. No distention, no guarding, no rebound. Nontender.  G-tube site with some granulation tissue. Genitourinary/rectal:Deferred Musculoskeletal: Nontender with normal range of motion in all extremities. No joint effusions.  No lower extremity tenderness.  No edema. Neurologic:  Normal speech and language. No gross or focal neurologic deficits are appreciated. Skin:  Skin is warm, dry and intact. No rash noted. Psychiatric: Mood and affect  are normal. Speech and behavior are normal. Patient exhibits appropriate insight and judgment.   ____________________________________________  LABS (pertinent positives/negatives) I, Lisa Roca, MD the attending physician have reviewed the labs noted below.  Labs Reviewed - No data to display  ____________________________________________    EKG I, Lisa Roca, MD, the attending physician have personally viewed and interpreted all ECGs.  None ____________________________________________  RADIOLOGY   Abdomen 1 view: Proper position of percutaneous gastrostomy tube __________________________________________  PROCEDURES  Procedure(s) performed: Feeding tube replacement.  Replaced by myself.  16 French feeding tube.  Easily placed.  Confirmed placement with  Gastrografin   Procedures  Critical Care performed: None   ____________________________________________  ED COURSE / ASSESSMENT AND PLAN  Pertinent labs & imaging results that were available during my care of the patient were reviewed by me and considered in my medical decision making (see chart for details).      CONSULTATIONS:   None   Patient / Family / Caregiver informed of clinical course, medical decision-making process, and agree with plan.   I discussed return precautions, follow-up instructions, and discharge instructions with patient and/or family.  Discharge Instructions : Return for any problem with the feeding tube.    ___________________________________________   FINAL CLINICAL IMPRESSION(S) / ED DIAGNOSES   Final diagnoses:  None      ___________________________________________         Note: This dictation was prepared with Dragon dictation. Any transcriptional errors that result from this process are unintentional    Lisa Roca, MD 04/06/18 1527    Lisa Roca, MD 04/06/18 [phone]

## 2018-05-05 ENCOUNTER — Encounter: Payer: Self-pay | Admitting: Emergency Medicine

## 2018-05-05 ENCOUNTER — Other Ambulatory Visit: Payer: Self-pay

## 2018-05-05 ENCOUNTER — Emergency Department
Admission: EM | Admit: 2018-05-05 | Discharge: 2018-05-05 | Disposition: A | Discharge status: Home / Self Care | Payer: Self-pay | Attending: Emergency Medicine | Admitting: Emergency Medicine

## 2018-05-05 DIAGNOSIS — L03811 Cellulitis of head [any part, except face]: Secondary | ICD-10-CM | POA: Insufficient documentation

## 2018-05-05 DIAGNOSIS — Z8512 Personal history of malignant neoplasm of trachea: Secondary | ICD-10-CM | POA: Insufficient documentation

## 2018-05-05 DIAGNOSIS — F17200 Nicotine dependence, unspecified, uncomplicated: Secondary | ICD-10-CM | POA: Insufficient documentation

## 2018-05-05 MED ORDER — CEPHALEXIN 500 MG PO CAPS: 500.0000 mg | ORAL_CAPSULE | Freq: Three times a day (TID) | ORAL | 0 refills | Status: AC

## 2018-05-05 MED ORDER — CEPHALEXIN 500 MG PO CAPS
500.0000 mg | ORAL_CAPSULE | Freq: Once | ORAL | Status: AC
  Administered 2018-05-05: 500 mg via ORAL
  Filled 2018-05-05: qty 1

## 2018-05-05 NOTE — ED Provider Notes (Signed)
[facility] Emergency Department Provider Note ____________________________________________  Time seen: 1010  I have reviewed the triage vital signs and the nursing notes.  HISTORY  Chief Complaint  Abscess  HPI Matthew Peters is a 62 y.o. male who presents to the ED accompanied by his wife, for evaluation of a possible abscess to the back of the patient's head.  Patient apparently has had an area to the back of her head is been increasing in redness and irritation for the last several days.  Patient scratched it, and now believes it may be infected. The wife admits to daily washes with peroxide. She denies any spread, purulent drainage, or surrounding redness. The patient notes the area is tender to palpation.  No reports of any intermittent fevers, purulent drainage, nausea, vomiting, or dizziness.  Patient has a history of squamous cell carcinoma.  Past Medical History:  Diagnosis Date  . Squamous cell carcinoma of trachea (HCC)     There are no active problems to display for this patient.   Past Surgical History:  Procedure Laterality Date  . GASTROSTOMY TUBE PLACEMENT      Prior to Admission medications   Medication Sig Start Date End Date Taking? Authorizing Provider  cephALEXin (KEFLEX) 500 MG capsule Take 1 capsule (500 mg total) by mouth 3 (three) times daily for 10 days. 05/05/18 05/15/18  Teauna Dubach, Dannielle Karvonen, PA-C    Allergies Sulfa antibiotics  History reviewed. No pertinent family history.  Social History Social History   Tobacco Use  . Smoking status: Current Every Day Smoker  . Smokeless tobacco: Never Used  Substance Use Topics  . Alcohol use: No  . Drug use: No    Review of Systems  Constitutional: Negative for fever. Eyes: Negative for visual changes. ENT: Negative for sore throat. Cardiovascular: Negative for chest pain. Respiratory: Negative for shortness of breath. Skin: Negative for rash. Scalp lesion as  above Neurological: Negative for headaches, focal weakness or numbness. ____________________________________________  PHYSICAL EXAM:  VITAL SIGNS: ED Triage Vitals  Enc Vitals Group     BP 05/05/18 0949 107/78     Pulse Rate 05/05/18 0949 (!) 102     Resp 05/05/18 0949 18     Temp 05/05/18 0949 98.6 F (37 C)     Temp Source 05/05/18 0949 Oral     SpO2 05/05/18 0949 97 %     Weight 05/05/18 0949 118 lb (53.5 kg)     Height 05/05/18 0949 5\' 6"  (1.676 m)     Head Circumference --      Peak Flow --      Pain Score 05/05/18 0953 0     Pain Loc --      Pain Edu? --      Excl. in [place]? --     Constitutional: Alert and oriented. Well appearing and in no distress. Head: Normocephalic and atraumatic. Patient with a single, crusty, inflamed nodule to the crown of the scalp. No surrounding erythema, induration, or streaking. The lesion is tender and without fluctuance.  Eyes: Conjunctivae are normal. Normal extraocular movements Hematological/Lymphatic/Immunological: No cervical lymphadenopathy. Cardiovascular: Normal rate, regular rhythm. Normal distal pulses. Respiratory: Normal respiratory effort. No wheezes/rales/rhonchi. Skin:  Skin is warm, dry and intact. No rash noted.  ____________________________________________  PROCEDURES  Procedures Keflex 500 mg PO ____________________________________________  INITIAL IMPRESSION / ASSESSMENT AND PLAN / ED COURSE  Patient with ED evaluation of a lesion to the back of the scalp.  It is concerning for  infection and cellulitis.  The lesion appears inflamed and locally infected.  No pointing or fluctuance is appreciated.  Lesion will be treated empirically with Keflex for potential staph infection.  Patient will follow with his ENT provider next week as scheduled.  Return precautions have been reviewed. ____________________________________________  FINAL CLINICAL IMPRESSION(S) / ED DIAGNOSES  Final diagnoses:  Abscess or cellulitis of  scalp      Carmie End, Dannielle Karvonen, PA-C 05/05/18 1800    Lavonia Drafts, MD 05/06/18 1353

## 2018-05-05 NOTE — ED Triage Notes (Signed)
Pt to ED via POV with wife who states that pt has an abscess on the back of his head that has been there for several days, pt scratched it and thinks it may be getting infected. Pt is in NAD at this time.

## 2018-05-05 NOTE — Discharge Instructions (Addendum)
You are being treated for cellulitis. Take the antibiotic as directed. Apply the antibiotic ointment daily. Cleanse with soap & water only.

## 2018-07-30 DIAGNOSIS — F325 Major depressive disorder, single episode, in full remission: Secondary | ICD-10-CM | POA: Insufficient documentation

## 2019-11-12 DIAGNOSIS — Z87891 Personal history of nicotine dependence: Secondary | ICD-10-CM | POA: Diagnosis not present

## 2019-11-12 DIAGNOSIS — C109 Malignant neoplasm of oropharynx, unspecified: Secondary | ICD-10-CM | POA: Diagnosis not present

## 2019-11-12 DIAGNOSIS — Z8589 Personal history of malignant neoplasm of other organs and systems: Secondary | ICD-10-CM | POA: Diagnosis not present

## 2019-11-12 DIAGNOSIS — K029 Dental caries, unspecified: Secondary | ICD-10-CM | POA: Diagnosis not present

## 2019-11-12 DIAGNOSIS — Y842 Radiological procedure and radiotherapy as the cause of abnormal reaction of the patient, or of later complication, without mention of misadventure at the time of the procedure: Secondary | ICD-10-CM | POA: Diagnosis not present

## 2019-11-12 DIAGNOSIS — Z79899 Other long term (current) drug therapy: Secondary | ICD-10-CM | POA: Diagnosis not present

## 2019-11-12 DIAGNOSIS — Z923 Personal history of irradiation: Secondary | ICD-10-CM | POA: Diagnosis not present

## 2019-11-12 DIAGNOSIS — M272 Inflammatory conditions of jaws: Secondary | ICD-10-CM | POA: Diagnosis not present

## 2019-11-12 DIAGNOSIS — Z882 Allergy status to sulfonamides status: Secondary | ICD-10-CM | POA: Diagnosis not present

## 2019-11-26 DIAGNOSIS — R0989 Other specified symptoms and signs involving the circulatory and respiratory systems: Secondary | ICD-10-CM | POA: Diagnosis not present

## 2019-11-26 DIAGNOSIS — C760 Malignant neoplasm of head, face and neck: Secondary | ICD-10-CM | POA: Diagnosis not present

## 2019-11-26 DIAGNOSIS — C109 Malignant neoplasm of oropharynx, unspecified: Secondary | ICD-10-CM | POA: Diagnosis not present

## 2020-02-11 DIAGNOSIS — E034 Atrophy of thyroid (acquired): Secondary | ICD-10-CM | POA: Diagnosis not present

## 2020-02-11 DIAGNOSIS — K029 Dental caries, unspecified: Secondary | ICD-10-CM | POA: Diagnosis not present

## 2020-02-11 DIAGNOSIS — C760 Malignant neoplasm of head, face and neck: Secondary | ICD-10-CM | POA: Diagnosis not present

## 2020-02-11 DIAGNOSIS — C109 Malignant neoplasm of oropharynx, unspecified: Secondary | ICD-10-CM | POA: Diagnosis not present

## 2020-06-09 DIAGNOSIS — M272 Inflammatory conditions of jaws: Secondary | ICD-10-CM | POA: Diagnosis not present

## 2020-06-09 DIAGNOSIS — C109 Malignant neoplasm of oropharynx, unspecified: Secondary | ICD-10-CM | POA: Diagnosis not present

## 2020-06-09 DIAGNOSIS — E039 Hypothyroidism, unspecified: Secondary | ICD-10-CM | POA: Diagnosis not present

## 2020-06-09 DIAGNOSIS — Y842 Radiological procedure and radiotherapy as the cause of abnormal reaction of the patient, or of later complication, without mention of misadventure at the time of the procedure: Secondary | ICD-10-CM | POA: Diagnosis not present

## 2020-06-11 ENCOUNTER — Other Ambulatory Visit: Payer: Self-pay

## 2020-06-11 ENCOUNTER — Encounter: Payer: Self-pay | Admitting: Family Medicine

## 2020-06-11 ENCOUNTER — Ambulatory Visit (INDEPENDENT_AMBULATORY_CARE_PROVIDER_SITE_OTHER): Payer: Medicare Other | Admitting: Family Medicine

## 2020-06-11 VITALS — BP 140/80 | HR 79 | Ht 67.0 in | Wt 129.3 lb

## 2020-06-11 DIAGNOSIS — E032 Hypothyroidism due to medicaments and other exogenous substances: Secondary | ICD-10-CM | POA: Diagnosis not present

## 2020-06-11 DIAGNOSIS — Z7689 Persons encountering health services in other specified circumstances: Secondary | ICD-10-CM | POA: Diagnosis not present

## 2020-06-11 DIAGNOSIS — Z0000 Encounter for general adult medical examination without abnormal findings: Secondary | ICD-10-CM | POA: Insufficient documentation

## 2020-06-11 NOTE — Assessment & Plan Note (Addendum)
Patient in today to establish care after intensive cancer therapy and is now in remission. He has been under oncology care for over 2 years and they have asked him to establish PCP as he is now in remission. 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 Patient denies pain, sob and is feeling well in general.

## 2020-06-11 NOTE — Progress Notes (Signed)
Established Patient Office Visit  SUBJECTIVE:  Subjective  Patient ID: Matthew Peters, male    DOB: Feb 19, 1956  Age: 65 y.o. MRN: [id number]  CC:  Chief Complaint  Patient presents with  . New Patient (Initial Visit)    HPI Matthew Peters is a 65 y.o. male presenting today for Patient in today to establish care after intensive cancer therapy and is now in remission. He has been under oncology care for over 2 years and they have asked him to establish PCP as he is now in remission.    Past Medical History:  Diagnosis Date  . Squamous cell carcinoma of trachea [facility])     Past Surgical History:  Procedure Laterality Date  . GASTROSTOMY TUBE PLACEMENT      No family history on file.  Social History   Socioeconomic History  . Marital status: Married    Spouse name: Not on file  . Number of children: Not on file  . Years of education: Not on file  . Highest education level: Not on file  Occupational History  . Not on file  Tobacco Use  . Smoking status: Current Every Day Smoker  . Smokeless tobacco: Never Used  Substance and Sexual Activity  . Alcohol use: No  . Drug use: No  . Sexual activity: Not Currently  Other Topics Concern  . Not on file  Social History Narrative  . Not on file   Social Determinants of Health   Financial Resource Strain: Not on file  Food Insecurity: Not on file  Transportation Needs: Not on file  Physical Activity: Not on file  Stress: Not on file  Social Connections: Not on file  Intimate Partner Violence: Not on file     Current Outpatient Medications:  .  levothyroxine (SYNTHROID) 50 MCG tablet, Take 50 mcg by mouth daily., Disp: , Rfl:  .  mirtazapine (REMERON) 30 MG tablet, Take 30 mg by mouth at bedtime., Disp: , Rfl:  .  pentoxifylline (TRENTAL) 400 MG CR tablet, Take 400 mg by mouth 3 (three) times daily., Disp: , Rfl:  .  vitamin E 180 MG (400 UNITS) capsule, Take 1 capsule by mouth daily., Disp: , Rfl:    Allergies   Allergen Reactions  . Sulfa Antibiotics Anaphylaxis    ROS Review of Systems  Constitutional: Negative.   HENT: Positive for dental problem and hearing loss.   Eyes: Negative.   Respiratory: Negative.   Cardiovascular: Negative.   Psychiatric/Behavioral: Negative.      OBJECTIVE:    Physical Exam Vitals and nursing note reviewed.  Constitutional:      Appearance: Normal appearance.  HENT:     Right Ear: Tympanic membrane normal.     Left Ear: Tympanic membrane normal.     Nose: Nose normal.     Mouth/Throat:     Mouth: Mucous membranes are moist.  Cardiovascular:     Rate and Rhythm: Normal rate and regular rhythm.     Pulses: Normal pulses.  Abdominal:     General: Abdomen is flat.  Musculoskeletal:     Cervical back: Normal range of motion.  Neurological:     Mental Status: He is alert.  Psychiatric:        Mood and Affect: Mood normal.     BP 140/80   Pulse 79   Ht 5\' 7"  (1.702 m)   Wt 129 lb 4.8 oz (58.7 kg)   BMI 20.25 kg/m  Wt Readings from Last 3  Encounters:  06/11/20 129 lb 4.8 oz (58.7 kg)  05/05/18 118 lb (53.5 kg)  04/06/18 124 lb (56.2 kg)    Health Maintenance Due  Topic Date Due  . Hepatitis C Screening  Never done  . COVID-19 Vaccine (1) Never done  . HIV Screening  Never done  . TETANUS/TDAP  Never done  . COLONOSCOPY (Pts 45-73yrs Insurance coverage will need to be confirmed)  Never done  . PNA vac Low Risk Adult (1 of 2 - PCV13) Never done  . INFLUENZA VACCINE  Never done    There are no preventive care reminders to display for this patient.  CBC Latest Ref Rng & Units 06/29/2016 10/19/2015 08/06/2013  WBC 3.8 - 10.6 K/uL 13.6(H) 9.7 16.3(H)  Hemoglobin 13.0 - 18.0 g/dL 15.7 15.3 15.7  Hematocrit 40.0 - 52.0 % 44.9 44.4 46.1  Platelets 150 - 440 K/uL 291 292 259   CMP Latest Ref Rng & Units 06/29/2016 10/19/2015 08/06/2013  Glucose 65 - 99 mg/dL 106(H) 140(H) 117(H)  BUN 6 - 20 mg/dL 23(H) 30(H) 20(H)  Creatinine 0.61 - 1.24 mg/dL  1.02 0.85 1.40(H)  Sodium 135 - 145 mmol/L 138 138 141  Potassium 3.5 - 5.1 mmol/L 4.1 3.8 3.9  Chloride 101 - 111 mmol/L 105 105 106  CO2 22 - 32 mmol/L 25 26 28   Calcium 8.9 - 10.3 mg/dL 8.7(L) 8.9 8.4(L)  Total Protein 6.4 - 8.2 g/dL - - 7.1  Total Bilirubin 0.2 - 1.0 mg/dL - - 0.5  Alkaline Phos Unit/L - - 108  AST 15 - 37 Unit/L - - 30  ALT 12 - 78 U/L - - 37    No results found for: TSH Lab Results  Component Value Date   ALBUMIN 4.0 08/06/2013   ANIONGAP 8 06/29/2016   No results found for: CHOL, HDL, LDLCALC, CHOLHDL No results found for: TRIG No results found for: HGBA1C    ASSESSMENT & PLAN:   Problem List Items Addressed This Visit      Endocrine   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       Relevant Medications   levothyroxine (SYNTHROID) 50 MCG tablet     Other   Encounter to establish care with new doctor - Primary    Patient in today to establish care after intensive cancer therapy and is now in remission. He has been under oncology care for over 2 years and they have asked him to establish PCP as he is now in remission. 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 Patient denies pain, sob and is feeling well in general.          No orders of the defined types were placed in this encounter.     Follow-up: No follow-ups on file.    Beckie Salts, [place] [address], [place], [place] [address]

## 2020-06-11 NOTE — Assessment & Plan Note (Signed)
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

## 2020-07-01 IMAGING — DX DG ABDOMEN 1V
1 series · 1 of 1 positions shown · non-contrast
Comparison: None.

CLINICAL DATA: For peg tube placement, history of squamous cell
carcinoma of trachea, smokerfeeding tube placement - please use
gastrograffin

EXAM:
ABDOMEN - 1 VIEW

[abdomen kub]
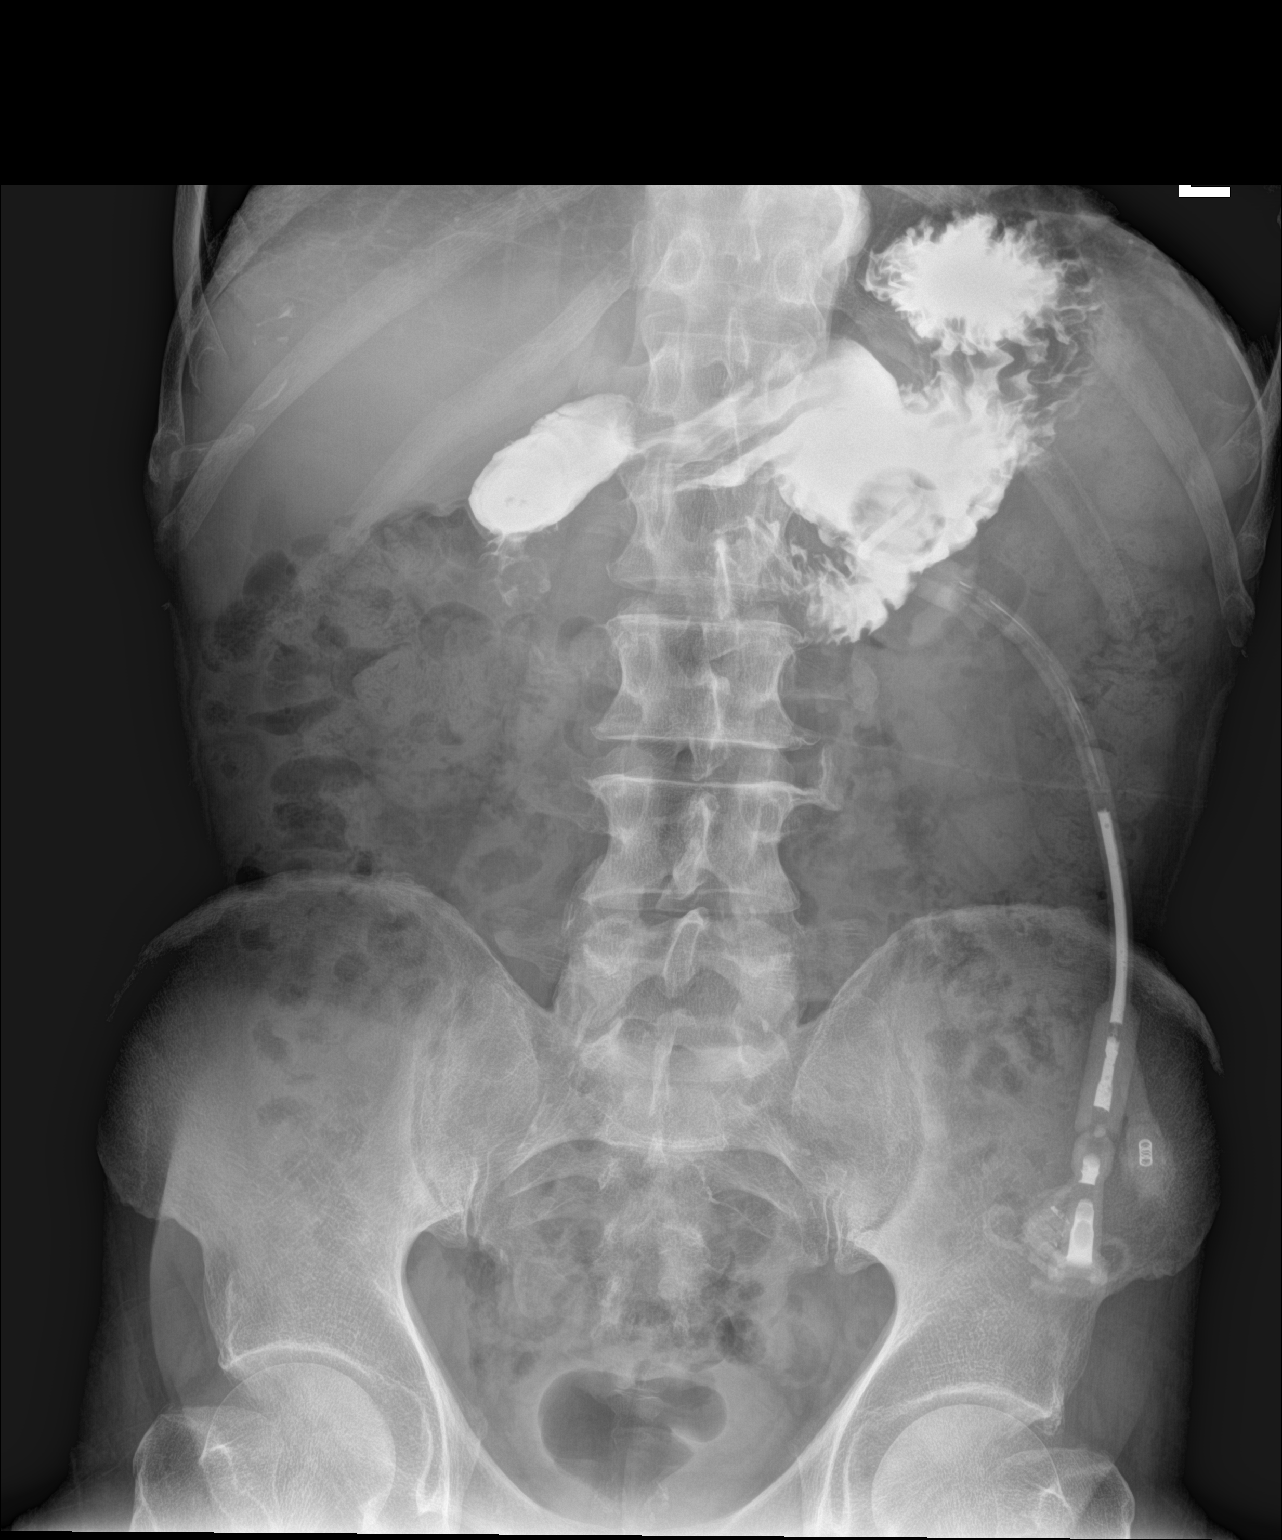

[1 of 1 positions shown; findings below may reference images not displayed]

FINDINGS: Hand injection of contrast through the percutaneous gastrostomy tube
demonstrates retention bulb within the stomach. Injected contrast
fills the stomach and duodenum. No evidence of leak or malposition.
IMPRESSION: Proper position of percutaneous gastrostomy tube.

## 2020-07-16 ENCOUNTER — Ambulatory Visit: Payer: Medicare Other | Admitting: Family Medicine

## 2020-07-30 ENCOUNTER — Encounter: Payer: Self-pay | Admitting: Family Medicine

## 2020-07-30 ENCOUNTER — Ambulatory Visit (INDEPENDENT_AMBULATORY_CARE_PROVIDER_SITE_OTHER): Payer: Medicare Other | Admitting: Family Medicine

## 2020-07-30 ENCOUNTER — Other Ambulatory Visit: Payer: Self-pay

## 2020-07-30 VITALS — BP 137/67 | HR 82 | Ht 67.0 in | Wt 127.0 lb

## 2020-07-30 DIAGNOSIS — Z0000 Encounter for general adult medical examination without abnormal findings: Secondary | ICD-10-CM | POA: Diagnosis not present

## 2020-07-30 NOTE — Assessment & Plan Note (Signed)
Colon Screening- Declined PSA- Drawn Today TDAP- Unknown Shingles Vaccine- No Flu Vaccine- Yes Pneumonia Vaccine- No  Taking all meds and doing well overall, eating 3 meals per day weight is stable.

## 2020-07-31 LAB — CBC WITH DIFFERENTIAL/PLATELET
Absolute Monocytes: 655 cells/uL (ref 200–950)
Basophils Absolute: 38 cells/uL (ref 0–200)
Basophils Relative: 0.6 %
Eosinophils Absolute: 290 cells/uL (ref 15–500)
Eosinophils Relative: 4.6 %
HCT: 45.8 % (ref 38.5–50.0)
Hemoglobin: 15.7 g/dL (ref 13.2–17.1)
Lymphs Abs: 1216 cells/uL (ref 850–3900)
MCH: 33.1 pg — ABNORMAL HIGH (ref 27.0–33.0)
MCHC: 34.3 g/dL (ref 32.0–36.0)
MCV: 96.4 fL (ref 80.0–100.0)
MPV: 9.6 fL (ref 7.5–12.5)
Monocytes Relative: 10.4 %
Neutro Abs: 4101 cells/uL (ref 1500–7800)
Neutrophils Relative %: 65.1 %
Platelets: 264 10*3/uL (ref 140–400)
RBC: 4.75 10*6/uL (ref 4.20–5.80)
RDW: 13.2 % (ref 11.0–15.0)
Total Lymphocyte: 19.3 %
WBC: 6.3 10*3/uL (ref 3.8–10.8)

## 2020-07-31 LAB — COMPLETE METABOLIC PANEL WITH GFR
AG Ratio: 1.7 (calc) (ref 1.0–2.5)
ALT: 6 U/L — ABNORMAL LOW (ref 9–46)
AST: 10 U/L (ref 10–35)
Albumin: 4.3 g/dL (ref 3.6–5.1)
Alkaline phosphatase (APISO): 75 U/L (ref 35–144)
BUN: 19 mg/dL (ref 7–25)
CO2: 25 mmol/L (ref 20–32)
Calcium: 9.1 mg/dL (ref 8.6–10.3)
Chloride: 104 mmol/L (ref 98–110)
Creat: 0.95 mg/dL (ref 0.70–1.25)
GFR, Est African American: 97 mL/min/{1.73_m2} (ref >=60)
GFR, Est Non African American: 84 mL/min/{1.73_m2} (ref >=60)
Globulin: 2.5 g/dL (calc) (ref 1.9–3.7)
Glucose, Bld: 93 mg/dL (ref 65–99)
Potassium: 4.4 mmol/L (ref 3.5–5.3)
Sodium: 140 mmol/L (ref 135–146)
Total Bilirubin: 0.4 mg/dL (ref 0.2–1.2)
Total Protein: 6.8 g/dL (ref 6.1–8.1)

## 2020-07-31 LAB — PSA: PSA: 1.11 ng/mL (ref <=4.0)

## 2020-07-31 LAB — TSH: TSH: 4.8 mIU/L — ABNORMAL HIGH (ref 0.40–4.50)

## 2020-08-02 ENCOUNTER — Encounter: Payer: Self-pay | Admitting: Internal Medicine

## 2020-08-02 ENCOUNTER — Ambulatory Visit (INDEPENDENT_AMBULATORY_CARE_PROVIDER_SITE_OTHER): Payer: Medicare Other | Admitting: Internal Medicine

## 2020-08-02 ENCOUNTER — Other Ambulatory Visit: Payer: Self-pay

## 2020-08-02 VITALS — BP 137/85 | HR 82 | Ht 67.0 in | Wt 126.5 lb

## 2020-08-02 DIAGNOSIS — R634 Abnormal weight loss: Secondary | ICD-10-CM | POA: Diagnosis not present

## 2020-08-02 DIAGNOSIS — R9431 Abnormal electrocardiogram [ECG] [EKG]: Secondary | ICD-10-CM | POA: Diagnosis not present

## 2020-08-02 DIAGNOSIS — E032 Hypothyroidism due to medicaments and other exogenous substances: Secondary | ICD-10-CM | POA: Diagnosis not present

## 2020-08-02 DIAGNOSIS — C760 Malignant neoplasm of head, face and neck: Secondary | ICD-10-CM

## 2020-08-02 DIAGNOSIS — K5903 Drug induced constipation: Secondary | ICD-10-CM | POA: Diagnosis not present

## 2020-08-02 NOTE — Progress Notes (Signed)
Established Patient Office Visit  Subjective:  Patient ID: Matthew Peters, male    DOB: Jan 18, 1956  Age: 65 y.o. MRN: [id number]  CC:  Chief Complaint  Patient presents with  . repeat ekg    HPI  Matthew Peters presents for abnormal EKG.,  Patient is known to have squamous cell carcinoma of the oropharynx and was treated with radiation and chemotherapy at [facility].  Her last EKG was slightly abnormal so EKG was repeated today which shows no extension of any abnormality.  Infiltrate noted patient has radiation therapy to the chest.  He does not give any history of heart attack.  Past Medical History:  Diagnosis Date  . Squamous cell carcinoma of trachea [facility])     Past Surgical History:  Procedure Laterality Date  . GASTROSTOMY TUBE PLACEMENT      History reviewed. No pertinent family history.  Social History   Socioeconomic History  . Marital status: Married    Spouse name: Not on file  . Number of children: Not on file  . Years of education: Not on file  . Highest education level: Not on file  Occupational History  . Not on file  Tobacco Use  . Smoking status: Current Every Day Smoker  . Smokeless tobacco: Never Used  Substance and Sexual Activity  . Alcohol use: No  . Drug use: No  . Sexual activity: Not Currently  Other Topics Concern  . Not on file  Social History Narrative  . Not on file   Social Determinants of Health   Financial Resource Strain: Not on file  Food Insecurity: Not on file  Transportation Needs: Not on file  Physical Activity: Not on file  Stress: Not on file  Social Connections: Not on file  Intimate Partner Violence: Not on file     Current Outpatient Medications:  .  levothyroxine (SYNTHROID) 50 MCG tablet, Take 50 mcg by mouth daily., Disp: , Rfl:  .  mirtazapine (REMERON) 30 MG tablet, Take 30 mg by mouth at bedtime., Disp: , Rfl:  .  pentoxifylline (TRENTAL) 400 MG CR tablet, Take 400 mg by mouth 3 (three) times daily.,  Disp: , Rfl:  .  vitamin E 180 MG (400 UNITS) capsule, Take 1 capsule by mouth daily., Disp: , Rfl:    Allergies  Allergen Reactions  . Sulfa Antibiotics Anaphylaxis    ROS Review of Systems  Constitutional: Negative.   HENT: Negative.   Eyes: Negative.   Respiratory: Negative for cough, chest tightness, shortness of breath and wheezing.   Cardiovascular: Negative for chest pain.  Gastrointestinal: Negative for abdominal distention and abdominal pain.  Endocrine: Negative for polydipsia.  Genitourinary: Negative for hematuria.  Musculoskeletal: Negative for arthralgias.  Neurological: Negative for dizziness.  Psychiatric/Behavioral: Negative for dysphoric mood.      Objective:    Physical Exam Vitals reviewed.  Constitutional:      Appearance: Normal appearance.  HENT:     Mouth/Throat:     Mouth: Mucous membranes are moist.  Eyes:     Pupils: Pupils are equal, round, and reactive to light.  Neck:     Vascular: No carotid bruit.  Cardiovascular:     Rate and Rhythm: Normal rate and regular rhythm.     Pulses: Normal pulses.     Heart sounds: Normal heart sounds. No murmur heard. No gallop.   Pulmonary:     Effort: Pulmonary effort is normal.     Breath sounds: Normal breath sounds.  Comments: Friction rub present in the chest Abdominal:     General: Bowel sounds are normal.     Palpations: Abdomen is soft. There is no hepatomegaly, splenomegaly or mass.     Tenderness: There is no abdominal tenderness.     Hernia: No hernia is present.  Musculoskeletal:     Cervical back: Neck supple.     Right lower leg: No edema.     Left lower leg: No edema.  Skin:    Findings: No rash.  Neurological:     Mental Status: He is alert and oriented to person, place, and time.     Motor: No weakness.  Psychiatric:        Mood and Affect: Mood normal.        Behavior: Behavior normal.     BP 137/85   Pulse 82   Ht 5\' 7"  (1.702 m)   Wt 126 lb 8 oz (57.4 kg)   BMI  19.81 kg/m  Wt Readings from Last 3 Encounters:  08/02/20 126 lb 8 oz (57.4 kg)  07/30/20 127 lb (57.6 kg)  06/11/20 129 lb 4.8 oz (58.7 kg)     Health Maintenance Due  Topic Date Due  . Hepatitis C Screening  Never done  . COVID-19 Vaccine (1) Never done  . HIV Screening  Never done  . TETANUS/TDAP  Never done  . COLONOSCOPY (Pts 45-88yrs Insurance coverage will need to be confirmed)  Never done  . PNA vac Low Risk Adult (1 of 2 - PCV13) Never done  . INFLUENZA VACCINE  Never done    There are no preventive care reminders to display for this patient.  Lab Results  Component Value Date   TSH 4.80 (H) 07/30/2020   Lab Results  Component Value Date   WBC 6.3 07/30/2020   HGB 15.7 07/30/2020   HCT 45.8 07/30/2020   MCV 96.4 07/30/2020   PLT 264 07/30/2020   Lab Results  Component Value Date   NA 140 07/30/2020   K 4.4 07/30/2020   CO2 25 07/30/2020   GLUCOSE 93 07/30/2020   BUN 19 07/30/2020   CREATININE 0.95 07/30/2020   BILITOT 0.4 07/30/2020   ALKPHOS 108 08/06/2013   AST 10 07/30/2020   ALT 6 (L) 07/30/2020   PROT 6.8 07/30/2020   ALBUMIN 4.0 08/06/2013   CALCIUM 9.1 07/30/2020   ANIONGAP 8 06/29/2016   No results found for: CHOL No results found for: HDL No results found for: LDLCALC No results found for: TRIG No results found for: CHOLHDL No results found for: HGBA1C    Assessment & Plan:   Problem List Items Addressed This Visit      Digestive   Drug-induced constipation    Under control.        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  He has evidence of pleuropericardial rub which may be a cause of abnormal EKG.      Weight loss    We will follow up patient for that.       Other Visit Diagnoses    Abnormal EKG    -  Primary   Relevant  Orders   EKG 12-Lead      No orders of the defined types were placed  in this encounter.   Follow-up: No follow-ups on file.    Cletis Athens, MD

## 2020-08-02 NOTE — Assessment & Plan Note (Signed)
We will follow up patient for that.

## 2020-08-02 NOTE — Assessment & Plan Note (Signed)
Under control 

## 2020-08-02 NOTE — Assessment & Plan Note (Signed)
Patient has received radiation treatment and chemotherapy for the cancer.  He has evidence of pleuropericardial rub which may be a cause of abnormal EKG.

## 2020-08-02 NOTE — Assessment & Plan Note (Signed)
Will follow it up.

## 2020-08-11 DIAGNOSIS — C760 Malignant neoplasm of head, face and neck: Secondary | ICD-10-CM | POA: Diagnosis not present

## 2020-08-11 DIAGNOSIS — E034 Atrophy of thyroid (acquired): Secondary | ICD-10-CM | POA: Diagnosis not present

## 2020-08-11 DIAGNOSIS — K029 Dental caries, unspecified: Secondary | ICD-10-CM | POA: Diagnosis not present

## 2020-08-11 DIAGNOSIS — C109 Malignant neoplasm of oropharynx, unspecified: Secondary | ICD-10-CM | POA: Diagnosis not present

## 2020-08-12 DIAGNOSIS — Z85818 Personal history of malignant neoplasm of other sites of lip, oral cavity, and pharynx: Secondary | ICD-10-CM | POA: Diagnosis not present

## 2020-08-12 DIAGNOSIS — Y842 Radiological procedure and radiotherapy as the cause of abnormal reaction of the patient, or of later complication, without mention of misadventure at the time of the procedure: Secondary | ICD-10-CM | POA: Diagnosis not present

## 2020-08-12 DIAGNOSIS — M272 Inflammatory conditions of jaws: Secondary | ICD-10-CM | POA: Diagnosis not present

## 2020-08-12 DIAGNOSIS — K029 Dental caries, unspecified: Secondary | ICD-10-CM | POA: Diagnosis not present

## 2020-08-12 DIAGNOSIS — Z01818 Encounter for other preprocedural examination: Secondary | ICD-10-CM | POA: Diagnosis not present

## 2020-08-18 NOTE — Progress Notes (Signed)
Established Patient Office Visit  SUBJECTIVE:  Subjective  Patient ID: Matthew Peters, male    DOB: 1956/04/26  Age: 65 y.o. MRN: [id number]  CC:  Chief Complaint  Patient presents with  . Annual Exam    HPI Matthew Peters is a 65 y.o. male presenting today for     Past Medical History:  Diagnosis Date  . 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 GASTROSTOMY TUBE PLACEMENT      History reviewed. No pertinent family history.  Social History   Socioeconomic History  . Marital status: Married    Spouse name: Not on file  . Number of children: Not on file  . Years of education: Not on file  . Highest education level: Not on file  Occupational History  . Not on file  Tobacco Use  . Smoking status: Current Every Day Smoker  . Smokeless tobacco: Never Used  Substance and Sexual Activity  . Alcohol use: No  . Drug use: No  . Sexual activity: Not Currently  Other Topics Concern  . Not on file  Social History Narrative  . Not on file   Social Determinants of Health   Financial Resource Strain: Not on file  Food Insecurity: Not on file  Transportation Needs: Not on file  Physical Activity: Not on file  Stress: Not on file  Social Connections: Not on file  Intimate Partner Violence: Not on file     Current Outpatient Medications:  .  levothyroxine (SYNTHROID) 50 MCG tablet, Take 50 mcg by mouth daily., Disp: , Rfl:  .  mirtazapine (REMERON) 30 MG tablet, Take 30 mg by mouth at bedtime., Disp: , Rfl:  .  pentoxifylline (TRENTAL) 400 MG CR tablet, Take 400 mg by mouth 3 (three) times daily., Disp: , Rfl:  .  vitamin E 180 MG (400 UNITS) capsule, Take 1 capsule by mouth daily., Disp: , Rfl:    Allergies  Allergen Reactions  . Sulfa Antibiotics Anaphylaxis    ROS Review of Systems  Constitutional: Negative.   HENT: Negative.   Eyes: Negative.   Respiratory: Negative.   Cardiovascular: Negative.    Gastrointestinal: Negative for abdominal pain and blood in stool.  Genitourinary: Negative.   Musculoskeletal: Negative.   Skin: Negative.   Neurological: Negative.   Psychiatric/Behavioral: Negative.      OBJECTIVE:    Physical Exam Vitals and nursing note reviewed.  Constitutional:      Appearance: Normal appearance.  Eyes:     Pupils: Pupils are equal, round, and reactive to light.  Abdominal:     General: Abdomen is flat.  Skin:    General: Skin is warm.  Neurological:     Mental Status: He is alert.  Psychiatric:        Mood and Affect: Mood normal.     BP 137/67   Pulse 82   Ht 5\' 7"  (1.702 m)   Wt 127 lb (57.6 kg)   BMI 19.89 kg/m  Wt Readings from Last 3 Encounters:  08/02/20 126 lb 8 oz (57.4 kg)  07/30/20 127 lb (57.6 kg)  06/11/20 129 lb 4.8 oz (58.7 kg)    Health Maintenance Due  Topic Date Due  . Hepatitis C Screening  Never done  . COVID-19 Vaccine (1) Never done  . HIV Screening  Never done  . TETANUS/TDAP  Never done  . COLONOSCOPY (Pts 45-29yrs Insurance coverage will need to  be confirmed)  Never done  . PNA vac Low Risk Adult (1 of 2 - PCV13) Never done  . INFLUENZA VACCINE  Never done    There are no preventive care reminders to display for this patient.  CBC Latest Ref Rng & Units 07/30/2020 06/29/2016 10/19/2015  WBC 3.8 - 10.8 Thousand/uL 6.3 13.6(H) 9.7  Hemoglobin 13.2 - 17.1 g/dL 15.7 15.7 15.3  Hematocrit 38.5 - 50.0 % 45.8 44.9 44.4  Platelets 140 - 400 Thousand/uL 264 291 292   CMP Latest Ref Rng & Units 07/30/2020 06/29/2016 10/19/2015  Glucose 65 - 99 mg/dL 93 106(H) 140(H)  BUN 7 - 25 mg/dL 19 23(H) 30(H)  Creatinine 0.70 - 1.25 mg/dL 0.95 1.02 0.85  Sodium 135 - 146 mmol/L 140 138 138  Potassium 3.5 - 5.3 mmol/L 4.4 4.1 3.8  Chloride 98 - 110 mmol/L 104 105 105  CO2 20 - 32 mmol/L 25 25 26   Calcium 8.6 - 10.3 mg/dL 9.1 8.7(L) 8.9  Total Protein 6.1 - 8.1 g/dL 6.8 - -  Total Bilirubin 0.2 - 1.2 mg/dL 0.4 - -  Alkaline  Phos Unit/L - - -  AST 10 - 35 U/L 10 - -  ALT 9 - 46 U/L 6(L) - -    Lab Results  Component Value Date   TSH 4.80 (H) 07/30/2020   Lab Results  Component Value Date   ALBUMIN 4.0 08/06/2013   ANIONGAP 8 06/29/2016   No results found for: CHOL, HDL, LDLCALC, CHOLHDL No results found for: TRIG No results found for: HGBA1C    ASSESSMENT & PLAN:   Problem List Items Addressed This Visit      Other   Annual physical exam - Primary    Colon Screening- Declined PSA- Drawn Today TDAP- Unknown Shingles Vaccine- No Flu Vaccine- Yes Pneumonia Vaccine- No  Taking all meds and doing well overall, eating 3 meals per day weight is stable.        Relevant Orders   EKG 12-Lead (Completed)   CBC with Differential/Platelet (Completed)   TSH (Completed)   PSA (Completed)   COMPLETE METABOLIC PANEL WITH GFR (Completed)      No orders of the defined types were placed in this encounter.     Follow-up: No follow-ups on file.    Beckie Salts, [place] [address] [place], [place] [address]

## 2020-10-29 ENCOUNTER — Ambulatory Visit: Payer: Medicare Other | Admitting: Family Medicine

## 2020-11-04 ENCOUNTER — Encounter: Payer: Self-pay | Admitting: Family Medicine

## 2020-11-04 ENCOUNTER — Ambulatory Visit (INDEPENDENT_AMBULATORY_CARE_PROVIDER_SITE_OTHER): Payer: Medicare Other | Admitting: Family Medicine

## 2020-11-04 ENCOUNTER — Other Ambulatory Visit: Payer: Self-pay

## 2020-11-04 VITALS — BP 134/78 | HR 72 | Ht 67.0 in | Wt 126.0 lb

## 2020-11-04 DIAGNOSIS — E032 Hypothyroidism due to medicaments and other exogenous substances: Secondary | ICD-10-CM

## 2020-11-04 DIAGNOSIS — Z8589 Personal history of malignant neoplasm of other organs and systems: Secondary | ICD-10-CM

## 2020-11-04 DIAGNOSIS — C4442 Squamous cell carcinoma of skin of scalp and neck: Secondary | ICD-10-CM

## 2020-11-04 DIAGNOSIS — R0989 Other specified symptoms and signs involving the circulatory and respiratory systems: Secondary | ICD-10-CM | POA: Diagnosis not present

## 2020-11-04 DIAGNOSIS — C760 Malignant neoplasm of head, face and neck: Secondary | ICD-10-CM

## 2020-11-04 NOTE — Assessment & Plan Note (Signed)
Going to order [place] R carotid artery today, will f/u after resulted.

## 2020-11-04 NOTE — Assessment & Plan Note (Signed)
Patient finished Chemo in 2020, has been following up with Hemonc every 3-6 months. Doing well with all fu exams.   Plan- Continue fu as planned next week.

## 2020-11-04 NOTE — Assessment & Plan Note (Signed)
Patient taking all medication as rx, denies any weight loss or gain, energy level wnl.   Plan- Continue meds.

## 2020-11-04 NOTE — Progress Notes (Signed)
Established Patient Office Visit  SUBJECTIVE:  Subjective  Patient ID: Matthew Peters, male    DOB: 11/03/1955  Age: 65 y.o. MRN: [id number]  CC:  Chief Complaint  Patient presents with  . Follow-up    3 month follow up, no complaints.    HPI Matthew Peters is a 65 y.o. male presenting today for     Past Medical History:  Diagnosis Date  . 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 GASTROSTOMY TUBE PLACEMENT      History reviewed. No pertinent family history.  Social History   Socioeconomic History  . Marital status: Married    Spouse name: Not on file  . Number of children: Not on file  . Years of education: Not on file  . Highest education level: Not on file  Occupational History  . Not on file  Tobacco Use  . Smoking status: Current Every Day Smoker  . Smokeless tobacco: Never Used  Substance and Sexual Activity  . Alcohol use: No  . Drug use: No  . Sexual activity: Not Currently  Other Topics Concern  . Not on file  Social History Narrative  . Not on file   Social Determinants of Health   Financial Resource Strain: Not on file  Food Insecurity: Not on file  Transportation Needs: Not on file  Physical Activity: Not on file  Stress: Not on file  Social Connections: Not on file  Intimate Partner Violence: Not on file     Current Outpatient Medications:  .  levothyroxine (SYNTHROID) 50 MCG tablet, Take 50 mcg by mouth daily., Disp: , Rfl:  .  mirtazapine (REMERON) 30 MG tablet, Take 30 mg by mouth at bedtime., Disp: , Rfl:  .  pentoxifylline (TRENTAL) 400 MG CR tablet, Take 400 mg by mouth 3 (three) times daily., Disp: , Rfl:  .  vitamin E 180 MG (400 UNITS) capsule, Take 1 capsule by mouth daily., Disp: , Rfl:    Allergies  Allergen Reactions  . Sulfa Antibiotics Anaphylaxis    ROS Review of Systems  Constitutional: Negative.   HENT: Positive for voice change.   Respiratory:  Negative.   Cardiovascular: Negative.   Musculoskeletal: Negative.   Psychiatric/Behavioral: Negative.      OBJECTIVE:    Physical Exam Constitutional:      Appearance: He is obese.  HENT:     Right Ear: Tympanic membrane normal.     Left Ear: Tympanic membrane normal.  Cardiovascular:     Rate and Rhythm: Normal rate and regular rhythm.     Pulses:          Carotid pulses are 2+ on the right side and 0 on the left side.    Heart sounds: Normal heart sounds.  Pulmonary:     Effort: Pulmonary effort is normal.  Skin:    General: Skin is warm.     BP 134/78   Pulse 72   Ht 5\' 7"  (1.702 m)   Wt 126 lb (57.2 kg)   BMI 19.73 kg/m  Wt Readings from Last 3 Encounters:  11/04/20 126 lb (57.2 kg)  08/02/20 126 lb 8 oz (57.4 kg)  07/30/20 127 lb (57.6 kg)    Health Maintenance Due  Topic Date Due  . COVID-19 Vaccine (1) Never done  . Hepatitis C Screening  Never done  . TETANUS/TDAP  Never done  . COLONOSCOPY (Pts 45-17yrs Insurance coverage will  need to be confirmed)  Never done  . Zoster Vaccines- Shingrix (1 of 2) Never done  . PNA vac Low Risk Adult (1 of 2 - PCV13) Never done    There are no preventive care reminders to display for this patient.  CBC Latest Ref Rng & Units 07/30/2020 06/29/2016 10/19/2015  WBC 3.8 - 10.8 Thousand/uL 6.3 13.6(H) 9.7  Hemoglobin 13.2 - 17.1 g/dL 15.7 15.7 15.3  Hematocrit 38.5 - 50.0 % 45.8 44.9 44.4  Platelets 140 - 400 Thousand/uL 264 291 292   CMP Latest Ref Rng & Units 07/30/2020 06/29/2016 10/19/2015  Glucose 65 - 99 mg/dL 93 106(H) 140(H)  BUN 7 - 25 mg/dL 19 23(H) 30(H)  Creatinine 0.70 - 1.25 mg/dL 0.95 1.02 0.85  Sodium 135 - 146 mmol/L 140 138 138  Potassium 3.5 - 5.3 mmol/L 4.4 4.1 3.8  Chloride 98 - 110 mmol/L 104 105 105  CO2 20 - 32 mmol/L 25 25 26   Calcium 8.6 - 10.3 mg/dL 9.1 8.7(L) 8.9  Total Protein 6.1 - 8.1 g/dL 6.8 - -  Total Bilirubin 0.2 - 1.2 mg/dL 0.4 - -  Alkaline Phos Unit/L - - -  AST 10 - 35 U/L 10 -  -  ALT 9 - 46 U/L 6(L) - -    Lab Results  Component Value Date   TSH 4.80 (H) 07/30/2020   Lab Results  Component Value Date   ALBUMIN 4.0 08/06/2013   ANIONGAP 8 06/29/2016   No results found for: CHOL, HDL, LDLCALC, CHOLHDL No results found for: TRIG No results found for: HGBA1C    ASSESSMENT & PLAN:   Problem List Items Addressed This Visit      Endocrine   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   Plan- Continue meds.         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 Doing well with all fu exams.   Plan- Continue fu as planned next week.          No orders of the defined types were placed in this encounter.     Follow-up: No follow-ups on file.    Beckie Salts, [place] [address], [place], [place] [address]

## 2020-11-09 ENCOUNTER — Other Ambulatory Visit: Payer: Self-pay

## 2020-11-17 DIAGNOSIS — C109 Malignant neoplasm of oropharynx, unspecified: Secondary | ICD-10-CM | POA: Diagnosis not present

## 2020-11-17 DIAGNOSIS — M272 Inflammatory conditions of jaws: Secondary | ICD-10-CM | POA: Diagnosis not present

## 2020-11-17 DIAGNOSIS — C760 Malignant neoplasm of head, face and neck: Secondary | ICD-10-CM | POA: Diagnosis not present

## 2020-11-17 DIAGNOSIS — Z87891 Personal history of nicotine dependence: Secondary | ICD-10-CM | POA: Diagnosis not present

## 2020-11-17 DIAGNOSIS — E039 Hypothyroidism, unspecified: Secondary | ICD-10-CM | POA: Diagnosis not present

## 2020-11-17 DIAGNOSIS — Z882 Allergy status to sulfonamides status: Secondary | ICD-10-CM | POA: Diagnosis not present

## 2020-11-17 DIAGNOSIS — Y842 Radiological procedure and radiotherapy as the cause of abnormal reaction of the patient, or of later complication, without mention of misadventure at the time of the procedure: Secondary | ICD-10-CM | POA: Diagnosis not present

## 2020-11-25 ENCOUNTER — Other Ambulatory Visit: Payer: Self-pay

## 2020-11-25 ENCOUNTER — Ambulatory Visit
Admission: RE | Admit: 2020-11-25 | Discharge: 2020-11-25 | Disposition: A | Discharge status: Home / Self Care | Payer: Medicare Other | Source: Ambulatory Visit | Attending: Family Medicine | Admitting: Family Medicine

## 2020-11-25 DIAGNOSIS — I6523 Occlusion and stenosis of bilateral carotid arteries: Secondary | ICD-10-CM | POA: Diagnosis not present

## 2020-11-25 DIAGNOSIS — R0989 Other specified symptoms and signs involving the circulatory and respiratory systems: Secondary | ICD-10-CM | POA: Diagnosis not present

## 2021-02-04 ENCOUNTER — Ambulatory Visit: Payer: Medicare Other | Admitting: Family Medicine

## 2021-02-16 DIAGNOSIS — E034 Atrophy of thyroid (acquired): Secondary | ICD-10-CM | POA: Diagnosis not present

## 2021-02-16 DIAGNOSIS — K029 Dental caries, unspecified: Secondary | ICD-10-CM | POA: Diagnosis not present

## 2021-02-16 DIAGNOSIS — C760 Malignant neoplasm of head, face and neck: Secondary | ICD-10-CM | POA: Diagnosis not present

## 2021-02-16 DIAGNOSIS — C109 Malignant neoplasm of oropharynx, unspecified: Secondary | ICD-10-CM | POA: Diagnosis not present

## 2021-02-22 ENCOUNTER — Encounter: Payer: Self-pay | Admitting: Internal Medicine

## 2021-02-22 ENCOUNTER — Ambulatory Visit (INDEPENDENT_AMBULATORY_CARE_PROVIDER_SITE_OTHER): Payer: Medicare Other | Admitting: Internal Medicine

## 2021-02-22 ENCOUNTER — Other Ambulatory Visit: Payer: Self-pay

## 2021-02-22 VITALS — BP 163/90 | HR 63 | Ht 67.0 in | Wt 129.1 lb

## 2021-02-22 DIAGNOSIS — K5903 Drug induced constipation: Secondary | ICD-10-CM

## 2021-02-22 DIAGNOSIS — R0989 Other specified symptoms and signs involving the circulatory and respiratory systems: Secondary | ICD-10-CM | POA: Diagnosis not present

## 2021-02-22 DIAGNOSIS — F325 Major depressive disorder, single episode, in full remission: Secondary | ICD-10-CM | POA: Diagnosis not present

## 2021-02-22 DIAGNOSIS — R634 Abnormal weight loss: Secondary | ICD-10-CM

## 2021-02-22 DIAGNOSIS — F17200 Nicotine dependence, unspecified, uncomplicated: Secondary | ICD-10-CM | POA: Insufficient documentation

## 2021-02-22 DIAGNOSIS — C760 Malignant neoplasm of head, face and neck: Secondary | ICD-10-CM

## 2021-02-22 DIAGNOSIS — E032 Hypothyroidism due to medicaments and other exogenous substances: Secondary | ICD-10-CM | POA: Diagnosis not present

## 2021-02-22 NOTE — Assessment & Plan Note (Signed)
Patient take levothyroxine for hypothyroidism

## 2021-02-22 NOTE — Assessment & Plan Note (Signed)
No history of TIA.

## 2021-02-22 NOTE — Assessment & Plan Note (Signed)
-   I instructed the patient to stop smoking and provided them with smoking cessation materials.  - I informed the patient that smoking puts them at increased risk for cancer, COPD, hypertension, and more.  - Informed the patient to seek help if they begin to have trouble breathing, develop chest pain, start to cough up blood, feel faint, or pass out.  

## 2021-02-22 NOTE — Assessment & Plan Note (Signed)
Patient was treated with radiation therapy from the cancer

## 2021-02-22 NOTE — Progress Notes (Signed)
Established Patient Office Visit  Subjective:  Patient ID: Matthew Peters, male    DOB: 1955-07-09  Age: 65 y.o. 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 No pertinent family history.  Social History   Socioeconomic History   Marital status: Married    Spouse name: Not on file   Number of children: Not on file   Years of education: Not on file   Highest education level: Not on file  Occupational History   Not on file  Tobacco Use   Smoking status: Every Day    Packs/day: 0.15    Years: 50.00    Pack years: 7.50    Types: Cigarettes   Smokeless tobacco: Never  Substance and Sexual Activity   Alcohol use: No   Drug use: No   Sexual activity: Not Currently  Other Topics Concern   Not on file  Social History Narrative   Not on file   Social Determinants of Health   Financial Resource Strain: Not on file  Food Insecurity: Not on file  Transportation Needs: Not on file  Physical Activity: Not on file  Stress: Not on file  Social Connections: Not on file  Intimate Partner Violence: Not on file     Current Outpatient Medications:    levothyroxine (SYNTHROID) 50 MCG tablet, Take 50 mcg by mouth daily., Disp: , Rfl:    mirtazapine (REMERON) 30 MG tablet, Take 30 mg by mouth at bedtime., Disp: , Rfl:    pentoxifylline (TRENTAL) 400 MG CR tablet, Take 400 mg by mouth 3 (three) times daily., Disp: , Rfl:    Allergies  Allergen Reactions   Sulfa Antibiotics Anaphylaxis    ROS Review of Systems  Constitutional: Negative.   HENT: Negative.    Eyes: Negative.   Respiratory: Negative.    Cardiovascular: Negative.   Gastrointestinal: Negative.   Endocrine: Negative.   Genitourinary: Negative.    Musculoskeletal: Negative.   Skin: Negative.   Allergic/Immunologic: Negative.   Neurological: Negative.   Hematological: Negative.   Psychiatric/Behavioral: Negative.    All other systems reviewed and are negative.    Objective:    Physical Exam Vitals reviewed.  Constitutional:      Appearance: Normal appearance.  HENT:     Mouth/Throat:     Mouth: Mucous membranes are moist.  Eyes:     Pupils: Pupils are equal, round, and reactive to light.  Neck:     Vascular: No carotid bruit.  Cardiovascular:     Rate and Rhythm: Normal rate and regular rhythm.     Pulses: Normal pulses.     Heart sounds: Normal heart sounds.  Pulmonary:     Effort: Pulmonary effort is normal.     Breath sounds: Normal breath sounds.  Abdominal:     General: Bowel sounds are normal.     Palpations: Abdomen is soft. There is no hepatomegaly, splenomegaly or mass.     Tenderness: There is no abdominal tenderness.     Hernia: No hernia is present.  Musculoskeletal:     Cervical back: Neck supple.     Right lower leg: No edema.     Left lower leg:  No edema.  Skin:    Findings: No rash.  Neurological:     Mental Status: He is alert and oriented to person, place, and time.     Motor: No weakness.  Psychiatric:        Mood and Affect: Mood normal.        Behavior: Behavior normal.    BP (!) 163/90   Pulse 63   Ht 5\' 7"  (1.702 m)   Wt 129 lb 1.6 oz (58.6 kg)   BMI 20.22 kg/m  Wt Readings from Last 3 Encounters:  02/22/21 129 lb 1.6 oz (58.6 kg)  11/04/20 126 lb (57.2 kg)  08/02/20 126 lb 8 oz (57.4 kg)     Health Maintenance Due  Topic Date Due   Hepatitis C Screening  Never done   TETANUS/TDAP  Never done   Zoster Vaccines- Shingrix (1 of 2) Never done   COLONOSCOPY (Pts 45-13yrs Insurance coverage will need to be confirmed)  Never done   COVID-19 Vaccine (4 - Booster for [facility] series) 06/25/2020   INFLUENZA VACCINE  Never done    There are no preventive care reminders to  display for this patient.  Lab Results  Component Value Date   TSH 4.80 (H) 07/30/2020   Lab Results  Component Value Date   WBC 6.3 07/30/2020   HGB 15.7 07/30/2020   HCT 45.8 07/30/2020   MCV 96.4 07/30/2020   PLT 264 07/30/2020   Lab Results  Component Value Date   NA 140 07/30/2020   K 4.4 07/30/2020   CO2 25 07/30/2020   GLUCOSE 93 07/30/2020   BUN 19 07/30/2020   CREATININE 0.95 07/30/2020   BILITOT 0.4 07/30/2020   ALKPHOS 108 08/06/2013   AST 10 07/30/2020   ALT 6 (L) 07/30/2020   PROT 6.8 07/30/2020   ALBUMIN 4.0 08/06/2013   CALCIUM 9.1 07/30/2020   ANIONGAP 8 06/29/2016   No results found for: CHOL No results found for: HDL No results found for: LDLCALC No results found for: TRIG No results found for: CHOLHDL No results found for: HGBA1C    Assessment & Plan:   Problem List Items Addressed This Visit       Digestive   Drug-induced constipation    Patient bowel is moving regularly now.        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      Squamous cell carcinoma of head and neck ([place])    Patient was treated with radiation therapy from the cancer      Weight loss    Weight is stable now.      Right carotid bruit    No history of TIA.      Smoker    - I instructed the patient to stop smoking and provided them with smoking cessation materials.  - I informed the patient that smoking puts them at increased risk for cancer, COPD, hypertension, and more.  - Informed the patient to seek help if they begin to have trouble breathing, develop chest pain, start to cough up blood, feel faint, or pass out.       No orders of the defined types were placed in this encounter. Patient has radiation and chemotherapy for throat  cancer  Follow-up: No  follow-ups on file.    Cletis Athens, MD

## 2021-02-22 NOTE — Assessment & Plan Note (Signed)
Patient bowel is moving regularly now.

## 2021-02-22 NOTE — Assessment & Plan Note (Signed)
Weight is stable now ?

## 2021-02-22 NOTE — Assessment & Plan Note (Signed)
Depression is in remission.

## 2021-03-16 DIAGNOSIS — C109 Malignant neoplasm of oropharynx, unspecified: Secondary | ICD-10-CM | POA: Diagnosis not present

## 2021-03-16 DIAGNOSIS — E039 Hypothyroidism, unspecified: Secondary | ICD-10-CM | POA: Diagnosis not present

## 2021-03-16 DIAGNOSIS — M272 Inflammatory conditions of jaws: Secondary | ICD-10-CM | POA: Diagnosis not present

## 2021-03-16 DIAGNOSIS — Y842 Radiological procedure and radiotherapy as the cause of abnormal reaction of the patient, or of later complication, without mention of misadventure at the time of the procedure: Secondary | ICD-10-CM | POA: Diagnosis not present

## 2021-03-16 DIAGNOSIS — Z85819 Personal history of malignant neoplasm of unspecified site of lip, oral cavity, and pharynx: Secondary | ICD-10-CM | POA: Diagnosis not present

## 2021-05-14 LAB — FECAL OCCULT BLOOD, GUAIAC: Fecal Occult Blood: NEGATIVE

## 2021-05-16 ENCOUNTER — Ambulatory Visit: Payer: Medicare Other | Admitting: Internal Medicine

## 2021-06-07 ENCOUNTER — Encounter: Payer: Self-pay | Admitting: *Deleted

## 2021-07-20 DIAGNOSIS — Z87891 Personal history of nicotine dependence: Secondary | ICD-10-CM | POA: Diagnosis not present

## 2021-07-20 DIAGNOSIS — C109 Malignant neoplasm of oropharynx, unspecified: Secondary | ICD-10-CM | POA: Diagnosis not present

## 2021-07-20 DIAGNOSIS — E039 Hypothyroidism, unspecified: Secondary | ICD-10-CM | POA: Diagnosis not present

## 2021-07-20 DIAGNOSIS — R6884 Jaw pain: Secondary | ICD-10-CM | POA: Diagnosis not present

## 2021-07-29 DIAGNOSIS — C760 Malignant neoplasm of head, face and neck: Secondary | ICD-10-CM | POA: Diagnosis not present

## 2021-07-29 DIAGNOSIS — J320 Chronic maxillary sinusitis: Secondary | ICD-10-CM | POA: Diagnosis not present

## 2021-07-29 DIAGNOSIS — C109 Malignant neoplasm of oropharynx, unspecified: Secondary | ICD-10-CM | POA: Diagnosis not present

## 2021-07-29 DIAGNOSIS — M898X8 Other specified disorders of bone, other site: Secondary | ICD-10-CM | POA: Diagnosis not present

## 2021-07-29 DIAGNOSIS — Z923 Personal history of irradiation: Secondary | ICD-10-CM | POA: Diagnosis not present

## 2022-01-18 DIAGNOSIS — Z882 Allergy status to sulfonamides status: Secondary | ICD-10-CM | POA: Diagnosis not present

## 2022-01-18 DIAGNOSIS — C109 Malignant neoplasm of oropharynx, unspecified: Secondary | ICD-10-CM | POA: Diagnosis not present

## 2022-01-18 DIAGNOSIS — M272 Inflammatory conditions of jaws: Secondary | ICD-10-CM | POA: Diagnosis not present

## 2022-01-18 DIAGNOSIS — E039 Hypothyroidism, unspecified: Secondary | ICD-10-CM | POA: Diagnosis not present

## 2022-01-18 DIAGNOSIS — Z87891 Personal history of nicotine dependence: Secondary | ICD-10-CM | POA: Diagnosis not present

## 2022-01-18 DIAGNOSIS — Z8589 Personal history of malignant neoplasm of other organs and systems: Secondary | ICD-10-CM | POA: Diagnosis not present

## 2022-01-18 DIAGNOSIS — Z79899 Other long term (current) drug therapy: Secondary | ICD-10-CM | POA: Diagnosis not present

## 2022-02-23 DIAGNOSIS — E0590 Thyrotoxicosis, unspecified without thyrotoxic crisis or storm: Secondary | ICD-10-CM | POA: Diagnosis not present

## 2022-05-20 ENCOUNTER — Observation Stay
Admission: EM | Admit: 2022-05-20 | Discharge: 2022-05-21 | Disposition: A | Discharge status: Home / Self Care | Payer: Medicare Other | Attending: Internal Medicine | Admitting: Internal Medicine

## 2022-05-20 ENCOUNTER — Other Ambulatory Visit: Payer: Self-pay

## 2022-05-20 ENCOUNTER — Emergency Department: Payer: Medicare Other

## 2022-05-20 DIAGNOSIS — J320 Chronic maxillary sinusitis: Secondary | ICD-10-CM | POA: Diagnosis not present

## 2022-05-20 DIAGNOSIS — Z79899 Other long term (current) drug therapy: Secondary | ICD-10-CM | POA: Insufficient documentation

## 2022-05-20 DIAGNOSIS — K1120 Sialoadenitis, unspecified: Principal | ICD-10-CM

## 2022-05-20 DIAGNOSIS — J020 Streptococcal pharyngitis: Secondary | ICD-10-CM | POA: Diagnosis not present

## 2022-05-20 DIAGNOSIS — R221 Localized swelling, mass and lump, neck: Secondary | ICD-10-CM | POA: Diagnosis not present

## 2022-05-20 DIAGNOSIS — K111 Hypertrophy of salivary gland: Secondary | ICD-10-CM | POA: Diagnosis not present

## 2022-05-20 DIAGNOSIS — C760 Malignant neoplasm of head, face and neck: Secondary | ICD-10-CM | POA: Diagnosis present

## 2022-05-20 DIAGNOSIS — E860 Dehydration: Secondary | ICD-10-CM | POA: Diagnosis not present

## 2022-05-20 DIAGNOSIS — Z8512 Personal history of malignant neoplasm of trachea: Secondary | ICD-10-CM | POA: Diagnosis not present

## 2022-05-20 DIAGNOSIS — Z1152 Encounter for screening for COVID-19: Secondary | ICD-10-CM | POA: Insufficient documentation

## 2022-05-20 DIAGNOSIS — E032 Hypothyroidism due to medicaments and other exogenous substances: Secondary | ICD-10-CM | POA: Insufficient documentation

## 2022-05-20 DIAGNOSIS — C4442 Squamous cell carcinoma of skin of scalp and neck: Secondary | ICD-10-CM | POA: Diagnosis not present

## 2022-05-20 DIAGNOSIS — Z789 Other specified health status: Secondary | ICD-10-CM | POA: Insufficient documentation

## 2022-05-20 DIAGNOSIS — F17200 Nicotine dependence, unspecified, uncomplicated: Secondary | ICD-10-CM | POA: Diagnosis present

## 2022-05-20 DIAGNOSIS — F17210 Nicotine dependence, cigarettes, uncomplicated: Secondary | ICD-10-CM | POA: Insufficient documentation

## 2022-05-20 LAB — CBC WITH DIFFERENTIAL/PLATELET
Abs Immature Granulocytes: 0.07 10*3/uL (ref 0.00–0.07)
Basophils Absolute: 0 10*3/uL (ref 0.0–0.1)
Basophils Relative: 0 %
Eosinophils Absolute: 0 10*3/uL (ref 0.0–0.5)
Eosinophils Relative: 0 %
HCT: 45.8 % (ref 39.0–52.0)
Hemoglobin: 14.8 g/dL (ref 13.0–17.0)
Immature Granulocytes: 1 %
Lymphocytes Relative: 5 %
Lymphs Abs: 0.5 10*3/uL — ABNORMAL LOW (ref 0.7–4.0)
MCH: 31.5 pg (ref 26.0–34.0)
MCHC: 32.3 g/dL (ref 30.0–36.0)
MCV: 97.4 fL (ref 80.0–100.0)
Monocytes Absolute: 1.3 10*3/uL — ABNORMAL HIGH (ref 0.1–1.0)
Monocytes Relative: 12 %
Neutro Abs: 8.9 10*3/uL — ABNORMAL HIGH (ref 1.7–7.7)
Neutrophils Relative %: 82 %
Platelets: 270 10*3/uL (ref 150–400)
RBC: 4.7 MIL/uL (ref 4.22–5.81)
RDW: 12.9 % (ref 11.5–15.5)
WBC: 10.8 10*3/uL — ABNORMAL HIGH (ref 4.0–10.5)
nRBC: 0 % (ref 0.0–0.2)

## 2022-05-20 LAB — COMPREHENSIVE METABOLIC PANEL
ALT: 17 U/L (ref 0–44)
AST: 18 U/L (ref 15–41)
Albumin: 3.8 g/dL (ref 3.5–5.0)
Alkaline Phosphatase: 69 U/L (ref 38–126)
Anion gap: 15 (ref 5–15)
BUN: 33 mg/dL — ABNORMAL HIGH (ref 8–23)
CO2: 18 mmol/L — ABNORMAL LOW (ref 22–32)
Calcium: 9 mg/dL (ref 8.9–10.3)
Chloride: 105 mmol/L (ref 98–111)
Creatinine, Ser: 0.9 mg/dL (ref 0.61–1.24)
GFR, Estimated: >60 mL/min (ref >60)
Glucose, Bld: 119 mg/dL — ABNORMAL HIGH (ref 70–99)
Potassium: 4 mmol/L (ref 3.5–5.1)
Sodium: 138 mmol/L (ref 135–145)
Total Bilirubin: 1.3 mg/dL — ABNORMAL HIGH (ref 0.3–1.2)
Total Protein: 7.7 g/dL (ref 6.5–8.1)

## 2022-05-20 LAB — RESP PANEL BY RT-PCR (RSV, FLU A&B, COVID)  RVPGX2
Influenza A by PCR: NEGATIVE
Influenza B by PCR: NEGATIVE
Resp Syncytial Virus by PCR: NEGATIVE
SARS Coronavirus 2 by RT PCR: NEGATIVE

## 2022-05-20 LAB — GROUP A STREP BY PCR: Group A Strep by PCR: DETECTED — AB

## 2022-05-20 LAB — LACTIC ACID, PLASMA: Lactic Acid, Venous: 1 mmol/L (ref 0.5–1.9)

## 2022-05-20 MED ORDER — MIRTAZAPINE 15 MG PO TABS
30.0000 mg | ORAL_TABLET | Freq: Every day | ORAL | Status: DC
  Administered 2022-05-20: 30 mg via ORAL
  Filled 2022-05-20: qty 2

## 2022-05-20 MED ORDER — MORPHINE SULFATE (PF) 4 MG/ML IV SOLN
4.0000 mg | Freq: Once | INTRAVENOUS | Status: AC
  Administered 2022-05-20: 4 mg via INTRAVENOUS
  Filled 2022-05-20: qty 1

## 2022-05-20 MED ORDER — LACTATED RINGERS IV SOLN: INTRAVENOUS | Status: DC

## 2022-05-20 MED ORDER — ENOXAPARIN SODIUM 40 MG/0.4ML IJ SOSY
40.0000 mg | PREFILLED_SYRINGE | INTRAMUSCULAR | Status: DC
  Administered 2022-05-21: 40 mg via SUBCUTANEOUS
  Filled 2022-05-20: qty 0.4

## 2022-05-20 MED ORDER — SODIUM CHLORIDE 0.9% FLUSH
3.0000 mL | Freq: Two times a day (BID) | INTRAVENOUS | Status: DC
  Administered 2022-05-20 – 2022-05-21 (×2): 3 mL via INTRAVENOUS

## 2022-05-20 MED ORDER — HYDROMORPHONE HCL 1 MG/ML IJ SOLN: 0.5000 mg | INTRAMUSCULAR | Status: DC | PRN

## 2022-05-20 MED ORDER — POLYETHYLENE GLYCOL 3350 17 G PO PACK: 17.0000 g | PACK | Freq: Every day | ORAL | Status: DC | PRN

## 2022-05-20 MED ORDER — LIDOCAINE VISCOUS HCL 2 % MT SOLN: 15.0000 mL | OROMUCOSAL | Status: DC | PRN

## 2022-05-20 MED ORDER — ONDANSETRON HCL 4 MG PO TABS: 4.0000 mg | ORAL_TABLET | Freq: Four times a day (QID) | ORAL | Status: DC | PRN

## 2022-05-20 MED ORDER — PENTOXIFYLLINE ER 400 MG PO TBCR
400.0000 mg | EXTENDED_RELEASE_TABLET | Freq: Three times a day (TID) | ORAL | Status: DC
  Administered 2022-05-20 – 2022-05-21 (×2): 400 mg via ORAL
  Filled 2022-05-20 (×3): qty 1

## 2022-05-20 MED ORDER — DEXAMETHASONE SODIUM PHOSPHATE 10 MG/ML IJ SOLN
10.0000 mg | Freq: Once | INTRAMUSCULAR | Status: AC
  Administered 2022-05-20: 10 mg via INTRAVENOUS
  Filled 2022-05-20: qty 1

## 2022-05-20 MED ORDER — SODIUM CHLORIDE 0.9 % IV SOLN
3.0000 g | Freq: Once | INTRAVENOUS | Status: AC
  Administered 2022-05-20: 3 g via INTRAVENOUS
  Filled 2022-05-20: qty 8

## 2022-05-20 MED ORDER — LEVOTHYROXINE SODIUM 50 MCG PO TABS
75.0000 ug | ORAL_TABLET | Freq: Every day | ORAL | Status: DC
  Administered 2022-05-21: 75 ug via ORAL
  Filled 2022-05-20: qty 1

## 2022-05-20 MED ORDER — ACETAMINOPHEN 650 MG RE SUPP: 650.0000 mg | Freq: Four times a day (QID) | RECTAL | Status: DC | PRN

## 2022-05-20 MED ORDER — ONDANSETRON HCL 4 MG/2ML IJ SOLN: 4.0000 mg | Freq: Four times a day (QID) | INTRAMUSCULAR | Status: DC | PRN

## 2022-05-20 MED ORDER — IOHEXOL 300 MG/ML  SOLN
75.0000 mL | Freq: Once | INTRAMUSCULAR | Status: AC | PRN
  Administered 2022-05-20: 75 mL via INTRAVENOUS

## 2022-05-20 MED ORDER — ACETAMINOPHEN 325 MG PO TABS: 650.0000 mg | ORAL_TABLET | Freq: Four times a day (QID) | ORAL | Status: DC | PRN

## 2022-05-20 MED ORDER — TRAZODONE HCL 50 MG PO TABS: 50.0000 mg | ORAL_TABLET | Freq: Every evening | ORAL | Status: DC | PRN

## 2022-05-20 MED ORDER — METHYLPREDNISOLONE SODIUM SUCC 125 MG IJ SOLR
125.0000 mg | Freq: Every day | INTRAMUSCULAR | Status: DC
  Administered 2022-05-20 – 2022-05-21 (×2): 125 mg via INTRAVENOUS
  Filled 2022-05-20 (×2): qty 2

## 2022-05-20 MED ORDER — SODIUM CHLORIDE 0.9 % IV BOLUS
1000.0000 mL | Freq: Once | INTRAVENOUS | Status: AC
  Administered 2022-05-20: 1000 mL via INTRAVENOUS

## 2022-05-20 MED ORDER — SODIUM CHLORIDE 0.9 % IV SOLN
3.0000 g | Freq: Four times a day (QID) | INTRAVENOUS | Status: DC
  Administered 2022-05-21 (×3): 3 g via INTRAVENOUS
  Filled 2022-05-20 (×3): qty 8

## 2022-05-20 MED ORDER — OXYCODONE HCL 5 MG PO TABS: 5.0000 mg | ORAL_TABLET | ORAL | Status: DC | PRN

## 2022-05-20 MED ORDER — ONDANSETRON HCL 4 MG/2ML IJ SOLN
4.0000 mg | Freq: Once | INTRAMUSCULAR | Status: AC
  Administered 2022-05-20: 4 mg via INTRAVENOUS
  Filled 2022-05-20: qty 2

## 2022-05-20 NOTE — ED Notes (Signed)
Patient is resting comfortably. 

## 2022-05-20 NOTE — ED Provider Notes (Signed)
[facility] Provider Note    Event Date/Time   First MD Initiated Contact with Patient 05/20/22 1716     (approximate)   History   Fatigue   HPI  Matthew Peters is a 66 y.o. male with history of squamous cell carcinoma of the trachea which is in remission presents to the emergency department complaining of neck swelling along with throat swelling and feeling unwell.  Patient states he has to hold his throat to swallow.  No difficulty breathing at this time.  He has had a low-grade fever at home.  No recent dental work      Physical Exam   Triage Vital Signs: ED Triage Vitals  Enc Vitals Group     BP 05/20/22 1524 109/84     Pulse Rate 05/20/22 1524 (!) 110     Resp 05/20/22 1524 18     Temp 05/20/22 1524 98.9 F (37.2 C)     Temp Source 05/20/22 1524 Oral     SpO2 05/20/22 1524 95 %     Weight --      Height --      Head Circumference --      Peak Flow --      Pain Score 05/20/22 1526 6     Pain Loc --      Pain Edu? --      Excl. in [place]? --     Most recent vital signs: Vitals:   05/20/22 2138 05/20/22 2200  BP: 135/80 136/80  Pulse: 67 68  Resp: 14 15  Temp: 98 F (36.7 C)   SpO2: 97% 95%     General: Awake, no distress.   CV:  Good peripheral perfusion.  Tachycardic and  rhythm Resp:  Normal effort. Lungs cta Abd:  No distention.   Other:   ENT: Neck with large amount of swelling noted on the left side area is hard on  palpation, trachea appears to be midline, there is no swelling underneath his tongue, there is a large amount of dark drainage noted in his mouth and in the back of his throat along with the area being very dry   ED Results / Procedures / Treatments   Labs (all labs ordered are listed, but only abnormal results are displayed) Labs Reviewed  GROUP A STREP BY PCR - Abnormal; Notable for the following components:      Result Value   Group A Strep by PCR DETECTED (*)    All other components within normal limits   CBC WITH DIFFERENTIAL/PLATELET - Abnormal; Notable for the following components:   WBC 10.8 (*)    Neutro Abs 8.9 (*)    Lymphs Abs 0.5 (*)    Monocytes Absolute 1.3 (*)    All other components within normal limits  COMPREHENSIVE METABOLIC PANEL - Abnormal; Notable for the following components:   CO2 18 (*)    Glucose, Bld 119 (*)    BUN 33 (*)    Total Bilirubin 1.3 (*)    All other components within normal limits  RESP PANEL BY RT-PCR (RSV, FLU A&B, COVID)  [id number]  LACTIC ACID, PLASMA  COMPREHENSIVE METABOLIC PANEL  CBC     EKG     RADIOLOGY CT soft tissue of the neck    PROCEDURES:   .Critical Care E&M  Performed by: Versie Starks, PA-C Critical care provider statement:    Critical care time (minutes):  45   Critical care time was exclusive of:  Separately billable procedures and treating other patients   Critical care was necessary to treat or prevent imminent or life-threatening deterioration of the following conditions:  Dehydration   Critical care was time spent personally by me on the following activities:  Blood draw for specimens, development of treatment plan with patient or surrogate, evaluation of patient's response to treatment, examination of patient, obtaining history from patient or surrogate, ordering and performing treatments and interventions, ordering and review of laboratory studies, ordering and review of radiographic studies, re-evaluation of patient's condition and review of old charts   Care discussed with: admitting provider   After initial E/M assessment, critical care services were subsequently performed that were exclusive of separately billable procedures or treatment.      MEDICATIONS ORDERED IN ED: Medications  methylPREDNISolone sodium succinate (SOLU-MEDROL) 125 mg/2 mL injection 125 mg (125 mg Intravenous Given 05/20/22 2150)  mirtazapine (REMERON) tablet 30 mg (30 mg Oral Given 05/20/22 2210)  levothyroxine (SYNTHROID) tablet  75 mcg (has no administration in time range)  pentoxifylline (TRENTAL) CR tablet 400 mg (400 mg Oral Given 05/20/22 2210)  enoxaparin (LOVENOX) injection 40 mg (has no administration in time range)  sodium chloride flush (NS) 0.9 % injection 3 mL (3 mLs Intravenous Given 05/20/22 2150)  lactated ringers infusion ( Intravenous New Bag/Given 05/20/22 2149)  acetaminophen (TYLENOL) tablet 650 mg (has no administration in time range)    Or  acetaminophen (TYLENOL) suppository 650 mg (has no administration in time range)  oxyCODONE (Oxy IR/ROXICODONE) immediate release tablet 5 mg (has no administration in time range)  lidocaine (XYLOCAINE) 2 % viscous mouth solution 15 mL (has no administration in time range)  HYDROmorphone (DILAUDID) injection 0.5-1 mg (has no administration in time range)  traZODone (DESYREL) tablet 50 mg (has no administration in time range)  polyethylene glycol (MIRALAX / GLYCOLAX) packet 17 g (has no administration in time range)  ondansetron (ZOFRAN) tablet 4 mg (has no administration in time range)    Or  ondansetron (ZOFRAN) injection 4 mg (has no administration in time range)  Ampicillin-Sulbactam (UNASYN) 3 g in sodium chloride 0.9 % 100 mL IVPB (has no administration in time range)  sodium chloride 0.9 % bolus 1,000 mL (0 mLs Intravenous Stopped 05/20/22 1956)  morphine (PF) 4 MG/ML injection 4 mg (4 mg Intravenous Given 05/20/22 1801)  ondansetron (ZOFRAN) injection 4 mg (4 mg Intravenous Given 05/20/22 1801)  Ampicillin-Sulbactam (UNASYN) 3 g in sodium chloride 0.9 % 100 mL IVPB (0 g Intravenous Stopped 05/20/22 1946)  dexamethasone (DECADRON) injection 10 mg (10 mg Intravenous Given 05/20/22 1916)  iohexol (OMNIPAQUE) 300 MG/ML solution 75 mL (75 mLs Intravenous Contrast Given 05/20/22 2002)     IMPRESSION / MDM / [place] / ED COURSE  I reviewed the triage vital signs and the nursing notes.                              Differential diagnosis  includes, but is not limited to, tonsillar abscess, mass, strep throat, dehydration  Patient's presentation is most consistent with acute presentation with potential threat to life or bodily function.   Concerns of airway, we have monitored very closely due to the large amount of swelling of the neck, he was given Unasyn and Decadron IV along with some fluids to try and rehydrate him.  Strep test is positive, WBC elevated 10.8, remainder of his labs are reassuring  CT soft tissue of the  neck does not show a peritonsillar abscess but does show a large amount of swelling in the salivary gland.  After the Decadron and the patient was feeling little better did try to have him drink liquids, he had a great deal of difficulty trying to swallow any liquid.  Consult to ENT, Dr  [place] Callas agrees  with treatment plan and admission consult on the floor  Consult to hospitalist, Dr Dione Plover is admitting, patient is still in stable condition and airway still patent at time of admission      FINAL CLINICAL IMPRESSION(S) / ED DIAGNOSES   Final diagnoses:  Acute streptococcal pharyngitis  Sialadenitis  Dehydration     Rx / DC Orders   ED Discharge Orders     None        Note:  This document was prepared using Dragon voice recognition software and may include unintentional dictation errors.    Versie Starks, PA-C 05/20/22 2344    Lucillie Garfinkel, MD 05/21/22 1150

## 2022-05-20 NOTE — H&P (Signed)
History and Physical    Patient: Matthew Peters HYQ:[id number] DOB: November 27, 1955 DOA: 05/20/2022 DOS: the patient was seen and examined on 05/20/2022 PCP: Cletis Athens, MD  Patient coming from: Home  Chief Complaint:  Chief Complaint  Patient presents with   Fatigue   HPI: Matthew Peters is a 66 y.o. 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  Patient and his wife interviewed at bedside.  They report that he has been feeling unwell since yesterday morning.  His throat has been hurting significantly and he feels like the left side of his neck is swelling.  It has been difficult to take any sort of p.o. due to the pain.  They also report that the left side of his neck looks red, this is new and has not been chronic since his radiation therapy.  His wife reports he has had a bit of a cough as well though he does not recall coughing much.  Does not report any other infectious symptoms.  In the ED vital signs notable for mild tachycardia but otherwise normal.  ED provider exam showed swelling of the left side of the neck.  CBC and CMP were unremarkable.  Respiratory panel PCR was negative for flu COVID and RSV.  Group A strep swab was positive.  CT soft tissue of the neck with contrast showed enhancement of the left submandibular gland consistent with acute sialoadenitis.  Case was discussed with on-call ENT who recommended Decadron, Unasyn, and observation given patient's report of difficulty swallowing and concern for airway involvement should progression occur.   Review of Systems: As mentioned in the history of present illness. All other systems reviewed and are negative. Past Medical History:  Diagnosis Date   Squamous cell carcinoma of trachea (HCC)    Past Surgical History:  Procedure Laterality Date   GASTROSTOMY TUBE PLACEMENT     Social History:  reports that he has been smoking cigarettes. He  has a 7.50 pack-year smoking history. He has never used smokeless tobacco. He reports that he does not drink alcohol and does not use drugs.  Allergies  Allergen Reactions   Sulfa Antibiotics Anaphylaxis    History reviewed. No pertinent family history.  Prior to Admission medications   Medication Sig Start Date End Date Taking? Authorizing Provider  levothyroxine (SYNTHROID) 50 MCG tablet Take 50 mcg by mouth daily. 05/07/20   [provider]  mirtazapine (REMERON) 30 MG tablet Take 30 mg by mouth at bedtime. 05/21/20   [provider]  pentoxifylline (TRENTAL) 400 MG CR tablet Take 400 mg by mouth 3 (three) times daily. 05/27/20   [provider]    Physical Exam: Vitals:   05/20/22 1524 05/20/22 2109  BP: 109/84 122/79  Pulse: (!) 110 70  Resp: 18 18  Temp: 98.9 F (37.2 C) 98.7 F (37.1 C)  TempSrc: Oral Oral  SpO2: 95% 97%   Physical Exam Constitutional:      General: He is awake. He is not in acute distress.    Appearance: He is cachectic.     Comments: Chronically ill appearing  HENT:     Mouth/Throat:     Mouth: Mucous membranes are dry.     Comments: No significant sublingual swelling appreciated bilaterally Eyes:     General: No scleral icterus. Neck:     Comments: Erythema extending from below the jawline down to the clavicle on the left side  of the neck, does not significantly cross the midline though there is what appears to be a single urticarial welt on the right side of the neck Cardiovascular:     Rate and Rhythm: Normal rate and regular rhythm.     Heart sounds: Normal heart sounds.  Pulmonary:     Effort: Pulmonary effort is normal. No respiratory distress.     Breath sounds: Normal breath sounds. No wheezing or rales.  Abdominal:     General: Abdomen is flat.     Palpations: Abdomen is soft.  Lymphadenopathy:     Cervical: No cervical adenopathy.  Skin:    General: Skin is warm and dry.     Findings: Rash present.   Neurological:     General: No focal deficit present.     Mental Status: He is alert.  Psychiatric:        Thought Content: Thought content normal.        Judgment: Judgment normal.      Data Reviewed: Results for orders placed or performed during the hospital encounter of 05/20/22 (from the past 24 hour(s))  CBC with Differential     Status: Abnormal   Collection Time: 05/20/22  3:28 PM  Result Value Ref Range   WBC 10.8 (H) 4.0 - 10.5 K/uL   RBC 4.70 4.22 - 5.81 MIL/uL   Hemoglobin 14.8 13.0 - 17.0 g/dL   HCT 45.8 39.0 - 52.0 %   MCV 97.4 80.0 - 100.0 fL   MCH 31.5 26.0 - 34.0 pg   MCHC 32.3 30.0 - 36.0 g/dL   RDW 12.9 11.5 - 15.5 %   Platelets 270 150 - 400 K/uL   nRBC 0.0 0.0 - 0.2 %   Neutrophils Relative % 82 %   Neutro Abs 8.9 (H) 1.7 - 7.7 K/uL   Lymphocytes Relative 5 %   Lymphs Abs 0.5 (L) 0.7 - 4.0 K/uL   Monocytes Relative 12 %   Monocytes Absolute 1.3 (H) 0.1 - 1.0 K/uL   Eosinophils Relative 0 %   Eosinophils Absolute 0.0 0.0 - 0.5 K/uL   Basophils Relative 0 %   Basophils Absolute 0.0 0.0 - 0.1 K/uL   Immature Granulocytes 1 %   Abs Immature Granulocytes 0.07 0.00 - 0.07 K/uL  Comprehensive metabolic panel     Status: Abnormal   Collection Time: 05/20/22  3:28 PM  Result Value Ref Range   Sodium 138 135 - 145 mmol/L   Potassium 4.0 3.5 - 5.1 mmol/L   Chloride 105 98 - 111 mmol/L   CO2 18 (L) 22 - 32 mmol/L   Glucose, Bld 119 (H) 70 - 99 mg/dL   BUN 33 (H) 8 - 23 mg/dL   Creatinine, Ser 0.90 0.61 - 1.24 mg/dL   Calcium 9.0 8.9 - 10.3 mg/dL   Total Protein 7.7 6.5 - 8.1 g/dL   Albumin 3.8 3.5 - 5.0 g/dL   AST 18 15 - 41 U/L   ALT 17 0 - 44 U/L   Alkaline Phosphatase 69 38 - 126 U/L   Total Bilirubin 1.3 (H) 0.3 - 1.2 mg/dL   GFR, Estimated >60 >60 mL/min   Anion gap 15 5 - 15  Group A Strep by PCR ([facility] Only)     Status: Abnormal   Collection Time: 05/20/22  5:22 PM   Specimen: Throat; Sterile Swab  Result Value Ref Range   Group A Strep by  PCR DETECTED (A) NOT DETECTED  Resp panel by RT-PCR (RSV,  Flu A&B, Covid) Throat     Status: None   Collection Time: 05/20/22  5:22 PM   Specimen: Throat; Nasal Swab  Result Value Ref Range   SARS Coronavirus 2 by RT PCR NEGATIVE NEGATIVE   Influenza A by PCR NEGATIVE NEGATIVE   Influenza B by PCR NEGATIVE NEGATIVE   Resp Syncytial Virus by PCR NEGATIVE NEGATIVE  Lactic acid, plasma     Status: None   Collection Time: 05/20/22  7:18 PM  Result Value Ref Range   Lactic Acid, Venous 1.0 0.5 - 1.9 mmol/L   CT Soft Tissue Neck W Contrast  Result Date: 05/20/2022 CLINICAL DATA:  Neck swelling EXAM: CT NECK WITH CONTRAST TECHNIQUE: Multidetector CT imaging of the neck was performed using the standard protocol following the bolus administration of intravenous contrast. RADIATION DOSE REDUCTION: This exam was performed according to the departmental dose-optimization program which includes automated exposure control, adjustment of the mA and/or kV according to patient size and/or use of iterative reconstruction technique. CONTRAST:  69m OMNIPAQUE IOHEXOL 300 MG/ML  SOLN COMPARISON:  None Available. FINDINGS: PHARYNX AND LARYNX: The nasopharynx, oropharynx and larynx are normal. Visible portions of the oral cavity, tongue base and floor of mouth are normal. Normal epiglottis, vallecula and pyriform sinuses. The larynx is normal. No retropharyngeal abscess, effusion or lymphadenopathy. SALIVARY GLANDS: Mild hyperenhancement of the left submandibular gland with adjacent inflammatory change. There is thickening of the overlying platysma. The other salivary glands are normal. THYROID: Normal. LYMPH NODES: No enlarged or abnormal density lymph nodes. VASCULAR: Major cervical vessels are patent. Calcific carotid and aortic atherosclerosis. LIMITED INTRACRANIAL: Normal. VISUALIZED ORBITS: Normal. MASTOIDS AND VISUALIZED PARANASAL SINUSES: Right mastoid effusion. Moderate maxillary and ethmoid sinusitis. SKELETON:  No bony spinal canal stenosis. No lytic or blastic lesions. UPPER CHEST: Biapical emphysema OTHER: None. IMPRESSION: 1. Mild hyperenhancement of the left submandibular gland with adjacent inflammatory change, consistent with acute sialoadenitis. 2. Right mastoid effusion. Aortic Atherosclerosis (ICD10-I70.0). Electronically Signed   By: KUlyses JarredM.D.   On: 05/20/2022 20:22     Assessment and Plan:  Matthew VERSTRAETEis a 66y.o. 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   * Sialoadenitis Sialoadenitis seen on CT scan, somewhat cellulitic appearance of overlying skin as well which patient reported as not been present since his radiation therapy was also not picked up on CT scan.  Regardless does not change management.  Per ENT they will see patient in the morning.  Started on Unasyn, has what appears to be a possible urticarial welts on the right side of his neck.  No prior listed allergies to penicillins, but has received steroids, watch closely for developing allergic reaction. - Unasyn per pharmacy - Solu-Medrol 125 daily - LR at 100 cc/h - Viscous lidocaine as needed - P.o. and IV pain meds as needed - Follow-up ENT recommendations in a.m.  Known medical problems Hypothyroidism-continue Synthroid Depression-continue Remeron nightly PAD-continue pentoxifylline      Advance Care Planning:   Code Status: Full Code , confirmed with patient  Consults: ENT  Family Communication: Wife updated at bedside  Severity of Illness: The appropriate patient status for this patient is OBSERVATION. Observation status is judged to be reasonable and necessary in order to provide the required intensity of service to ensure the patient's safety. The patient's presenting symptoms, physical exam findings, and initial radiographic and laboratory data  in the context of their  medical condition is felt to place them at decreased risk for further clinical deterioration. Furthermore, it is anticipated that the patient will be medically stable for discharge from the hospital within 2 midnights of admission.   Author: Clarnce Flock, MD 05/20/2022 9:28 PM  For on call review [email or web address].

## 2022-05-20 NOTE — ED Notes (Signed)
Hospitalist at bedside speaking with patient and family.

## 2022-05-20 NOTE — Progress Notes (Signed)
Pharmacy Antibiotic Note  Matthew Peters is a 66 y.o. male admitted on 05/20/2022 with {Indications:3041527}.  Pharmacy has been consulted for *** dosing ***.  Plan: *** *** q***hr per indication & renal fxn. {Assessment:21075}  Pt given Vancomycin *** mg once. Vancomycin *** mg IV Q *** hrs. Goal AUC 400-550. Expected AUC: *** SCr used: ***, Vd used: ***, BMI: ***, TBW *** kg < IBW *** kg  Pharmacy will continue to follow and will adjust abx dosing whenever warranted.  Temp (24hrs), Avg:98.5 F (36.9 C), Min:98 F (36.7 C), Max:98.9 F (37.2 C)   Recent Labs  Lab 05/20/22 1528 05/20/22 1918  WBC 10.8*  --   CREATININE 0.90  --   LATICACIDVEN  --  1.0    Estimated Creatinine Clearance: 63.9 mL/min (by C-G formula based on SCr of 0.9 mg/dL).    Allergies  Allergen Reactions   Sulfa Antibiotics Anaphylaxis    Antimicrobials this admission: *** *** >> *** *** *** >> *** *** *** >> ***  Microbiology results: *** BCx: *** *** UCx: ***  *** ***Cx: *** *** Sputum: ***  *** MRSA PCR: ***  Thank you for allowing pharmacy to be a part of this patient's care.  Renda Rolls, PharmD, [facility] 05/20/2022 11:27 PM

## 2022-05-20 NOTE — Assessment & Plan Note (Signed)
Sialoadenitis seen on CT scan, somewhat cellulitic appearance of overlying skin as well which patient reported as not been present since his radiation therapy was also not picked up on CT scan.  Regardless does not change management.  Per ENT they will see patient in the morning.  Started on Unasyn, has what appears to be a possible urticarial welts on the right side of his neck.  No prior listed allergies to penicillins, but has received steroids, watch closely for developing allergic reaction. - Unasyn per pharmacy - Solu-Medrol 125 daily - LR at 100 cc/h - Viscous lidocaine as needed - P.o. and IV pain meds as needed - Follow-up ENT recommendations in a.m.

## 2022-05-20 NOTE — ED Triage Notes (Signed)
Pt c/o tender left neck/thoat swelling and general feeling of un-wellness. Pt states he has to hold throat to swallow. Airway is patent, pt denies difficulty breathing. Pt also report nasal congestion. Pt denies N/V/D, dizziness, CP. Pt is AOX4, NAD noted. Left side of neck is slightly swollen, no nodules or mass palpated.

## 2022-05-20 NOTE — ED Notes (Signed)
Pt to CT at this time.

## 2022-05-20 NOTE — Assessment & Plan Note (Signed)
Hypothyroidism-continue Synthroid Depression-continue Remeron nightly PAD-continue pentoxifylline

## 2022-05-21 DIAGNOSIS — K1120 Sialoadenitis, unspecified: Secondary | ICD-10-CM | POA: Diagnosis not present

## 2022-05-21 LAB — COMPREHENSIVE METABOLIC PANEL
ALT: 15 U/L (ref 0–44)
AST: 21 U/L (ref 15–41)
Albumin: 3.3 g/dL — ABNORMAL LOW (ref 3.5–5.0)
Alkaline Phosphatase: 73 U/L (ref 38–126)
Anion gap: 6 (ref 5–15)
BUN: 31 mg/dL — ABNORMAL HIGH (ref 8–23)
CO2: 25 mmol/L (ref 22–32)
Calcium: 8.6 mg/dL — ABNORMAL LOW (ref 8.9–10.3)
Chloride: 109 mmol/L (ref 98–111)
Creatinine, Ser: 0.88 mg/dL (ref 0.61–1.24)
GFR, Estimated: >60 mL/min (ref >60)
Glucose, Bld: 154 mg/dL — ABNORMAL HIGH (ref 70–99)
Potassium: 4.8 mmol/L (ref 3.5–5.1)
Sodium: 140 mmol/L (ref 135–145)
Total Bilirubin: 0.9 mg/dL (ref 0.3–1.2)
Total Protein: 7.1 g/dL (ref 6.5–8.1)

## 2022-05-21 LAB — CBC
HCT: 47.2 % (ref 39.0–52.0)
Hemoglobin: 15 g/dL (ref 13.0–17.0)
MCH: 31.6 pg (ref 26.0–34.0)
MCHC: 31.8 g/dL (ref 30.0–36.0)
MCV: 99.6 fL (ref 80.0–100.0)
Platelets: 260 10*3/uL (ref 150–400)
RBC: 4.74 MIL/uL (ref 4.22–5.81)
RDW: 13 % (ref 11.5–15.5)
WBC: 9 10*3/uL (ref 4.0–10.5)
nRBC: 0 % (ref 0.0–0.2)

## 2022-05-21 MED ORDER — AMOXICILLIN-POT CLAVULANATE 875-125 MG PO TABS: 1.0000 | ORAL_TABLET | Freq: Two times a day (BID) | ORAL | 0 refills | Status: AC

## 2022-05-21 MED ORDER — PREDNISONE 10 MG (21) PO TBPK: ORAL_TABLET | ORAL | 0 refills | Status: DC

## 2022-05-21 NOTE — Hospital Course (Addendum)
Taken from prior notes.   EQUAN COGBILL is a 66 y.o. 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  Per patient this is new and was not there during radiation. Rest of the review of system was unremarkable.  ED course.  On presenting he was mildly tachycardic, otherwise unremarkable vitals. Labs mostly unremarkable.  COVID-19, influenza and RSV PCR was negative.  Group A strep swab was positive. CT soft tissue of neck with contrast shows enhancement of the left submandibular gland consistent with acute sialoadenitis.Case was discussed with on-call ENT who recommended Decadron, Unasyn, and observation given patient's report of difficulty swallowing and concern for airway involvement should progression occur.   Patient was admitted under observation for group A strep throat and concern of sialoadenitis  12/17: Hemodynamically stable.  No difficulty swallowing or breathing.  ENT was advising converting to Unasyn with Augmentin and a tapering course of steroid which was prescribed.  Patient remained stable and is being discharged to continue his current medications. Patient need to follow-up with his PCP and oncologist for further recommendations.

## 2022-05-21 NOTE — Discharge Summary (Signed)
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   Matthew Peters is a 66 y.o. 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  Per patient this is new and was not there during radiation. Rest of the review of system was unremarkable.  ED course.  On presenting he was mildly tachycardic, otherwise unremarkable vitals. Labs mostly unremarkable.  COVID-19, influenza and RSV PCR was negative.  Group A strep swab was positive. CT soft tissue of neck with contrast shows enhancement of the left submandibular gland consistent with acute sialoadenitis.Case was discussed with on-call ENT who recommended Decadron, Unasyn, and observation given patient's report of difficulty swallowing and concern for airway involvement should progression occur.   Patient was admitted under observation for group A strep throat and concern of sialoadenitis  12/17: Hemodynamically stable.  No difficulty swallowing or breathing.  ENT was advising converting to Unasyn with Augmentin and a tapering course of steroid which was prescribed.  Patient remained stable and is being discharged to continue his current medications. Patient need to follow-up with his PCP and oncologist for further recommendations.    Assessment and Plan: * Sialoadenitis Sialoadenitis seen on CT scan, somewhat  cellulitic appearance of overlying skin as well which patient reported as not been present since his radiation therapy was also not picked up on CT scan.  Regardless does not change management.  Per ENT they will see patient in the morning.  Started on Unasyn, has what appears to be a possible urticarial welts on the right side of his neck.  No prior listed allergies to penicillins, but has received steroids, watch closely for developing allergic reaction. - Unasyn per pharmacy - Solu-Medrol 125 daily - LR at 100 cc/h - Viscous lidocaine as needed - P.o. and IV pain meds as needed - Follow-up ENT recommendations in a.m.  Known medical problems Hypothyroidism-continue Synthroid Depression-continue Remeron nightly PAD-continue pentoxifylline   Consultants: ENT Procedures performed: None Disposition: Home Diet recommendation:  Discharge Diet Orders (From admission, onward)     Start     Ordered   05/21/22 0000  Diet - low sodium heart healthy        05/21/22 1311           Regular diet DISCHARGE MEDICATION: Allergies as of 05/21/2022       Reactions   Sulfa Antibiotics Anaphylaxis        Medication List     TAKE these medications    acetaminophen 325 MG tablet Commonly known as: TYLENOL Take 650 mg by mouth every 4 (four) hours as needed.   amoxicillin-clavulanate 875-125 MG tablet Commonly known as: AUGMENTIN Take 1 tablet by mouth 2 (two) times daily for 5 days.   levothyroxine 75 MCG tablet Commonly known as: SYNTHROID Take 1 tablet by mouth daily.   mirtazapine 30 MG tablet Commonly known as: REMERON Take 30  mg by mouth at bedtime.   pentoxifylline 400 MG CR tablet Commonly known as: TRENTAL Take 400 mg by mouth 3 (three) times daily.   predniSONE 10 MG (21) Tbpk tablet Commonly known as: STERAPRED UNI-PAK 21 TAB Take 4 tablets daily for next 2 days, then decrease 1 tablet every other day until you complete the package   Vitamin E/D-Alpha Natural  268 MG (400 UNIT) Caps Generic drug: Vitamin E Take 1 tablet by mouth daily.        Follow-up Information     Cletis Athens, MD. Schedule an appointment as soon as possible for a visit in 1 week(s).   Specialties: Internal Medicine, Cardiology Contact information: [place] [address] [phone]                Discharge Exam: Danley Danker Weights   05/20/22 2302  Weight: 56.7 kg   General.  Malnourished gentleman, in no acute distress. Pulmonary.  Lungs clear bilaterally, normal respiratory effort. CV.  Regular rate and rhythm, no JVD, rub or murmur. Abdomen.  Soft, nontender, nondistended, BS positive. CNS.  Alert and oriented .  No focal neurologic deficit. Extremities.  No edema, no cyanosis, pulses intact and symmetrical. Psychiatry.  Judgment and insight appears normal.   Condition at discharge: stable  The results of significant diagnostics from this hospitalization (including imaging, microbiology, ancillary and laboratory) are listed below for reference.   Imaging Studies: CT Soft Tissue Neck W Contrast  Result Date: 05/20/2022 CLINICAL DATA:  Neck swelling EXAM: CT NECK WITH CONTRAST TECHNIQUE: Multidetector CT imaging of the neck was performed using the standard protocol following the bolus administration of intravenous contrast. RADIATION DOSE REDUCTION: This exam was performed according to the departmental dose-optimization program which includes automated exposure control, adjustment of the mA and/or kV according to patient size and/or use of iterative reconstruction technique. CONTRAST:  62m OMNIPAQUE IOHEXOL 300 MG/ML  SOLN COMPARISON:  None Available. FINDINGS: PHARYNX AND LARYNX: The nasopharynx, oropharynx and larynx are normal. Visible portions of the oral cavity, tongue base and floor of mouth are normal. Normal epiglottis, vallecula and pyriform sinuses. The larynx is normal. No retropharyngeal abscess, effusion or lymphadenopathy. SALIVARY  GLANDS: Mild hyperenhancement of the left submandibular gland with adjacent inflammatory change. There is thickening of the overlying platysma. The other salivary glands are normal. THYROID: Normal. LYMPH NODES: No enlarged or abnormal density lymph nodes. VASCULAR: Major cervical vessels are patent. Calcific carotid and aortic atherosclerosis. LIMITED INTRACRANIAL: Normal. VISUALIZED ORBITS: Normal. MASTOIDS AND VISUALIZED PARANASAL SINUSES: Right mastoid effusion. Moderate maxillary and ethmoid sinusitis. SKELETON: No bony spinal canal stenosis. No lytic or blastic lesions. UPPER CHEST: Biapical emphysema OTHER: None. IMPRESSION: 1. Mild hyperenhancement of the left submandibular gland with adjacent inflammatory change, consistent with acute sialoadenitis. 2. Right mastoid effusion. Aortic Atherosclerosis (ICD10-I70.0). Electronically Signed   By: KUlyses JarredM.D.   On: 05/20/2022 20:22    Microbiology: Results for orders placed or performed during the hospital encounter of 05/20/22  Group A Strep by PCR ([place])     Status: Abnormal   Collection Time: 05/20/22  5:22 PM   Specimen: Throat; Sterile Swab  Result Value Ref Range Status   Group A Strep by PCR DETECTED (A) NOT DETECTED Final    Comment: [facility] [address], [place] [id number] Resp panel by RT-PCR (RSV, Flu A&B, Covid) Throat     Status: None   Collection Time: 05/20/22  5:22 PM  Specimen: Throat; Nasal Swab  Result Value Ref Range Status   SARS Coronavirus 2 by RT PCR NEGATIVE NEGATIVE Final    Comment: (NOTE) SARS-CoV-2 target nucleic acids are NOT DETECTED.  The SARS-CoV-2 RNA is generally detectable in upper respiratory specimens during the acute phase of infection. The lowest concentration of SARS-CoV-2 viral copies this assay can detect is 138 copies/mL. A negative result does not preclude SARS-Cov-2 infection and should not be used as the sole basis for treatment or other  patient management decisions. A negative result may occur with  improper specimen collection/handling, submission of specimen other than nasopharyngeal swab, presence of viral mutation(s) within the areas targeted by this assay, and inadequate number of viral copies(<138 copies/mL). A negative result must be combined with clinical observations, patient history, and epidemiological information. The expected result is Negative.  Fact Sheet for Patients:  EntrepreneurPulse.com.au  Fact Sheet for Healthcare Providers:  IncredibleEmployment.be  This test is no t yet approved or cleared by the [place] FDA and  has been authorized for detection and/or diagnosis of SARS-CoV-2 by FDA under an Emergency Use Authorization (EUA). This EUA will remain  in effect (meaning this test can be used) for the duration of the COVID-19 declaration under Section 564(b)(1) of the Act, 21 U.S.C.section 360bbb-3(b)(1), unless the authorization is terminated  or revoked sooner.       Influenza A by PCR NEGATIVE NEGATIVE Final   Influenza B by PCR NEGATIVE NEGATIVE Final    Comment: (NOTE) The Xpert Xpress SARS-CoV-2/FLU/RSV plus assay is intended as an aid in the diagnosis of influenza from Nasopharyngeal swab specimens and should not be used as a sole basis for treatment. Nasal washings and aspirates are unacceptable for Xpert Xpress SARS-CoV-2/FLU/RSV testing.  Fact Sheet for Patients: EntrepreneurPulse.com.au  Fact Sheet for Healthcare Providers: IncredibleEmployment.be  This test is not yet approved or cleared by the [place] FDA and has been authorized for detection and/or diagnosis of SARS-CoV-2 by FDA under an Emergency Use Authorization (EUA). This EUA will remain in effect (meaning this test can be used) for the duration of the COVID-19 declaration under Section 564(b)(1) of the Act, 21 U.S.C. section  360bbb-3(b)(1), unless the authorization is terminated or revoked.     Resp Syncytial Virus by PCR NEGATIVE NEGATIVE Final    Comment: (NOTE) Fact Sheet for Patients: EntrepreneurPulse.com.au  Fact Sheet for Healthcare Providers: IncredibleEmployment.be  This test is not yet approved or cleared by the [place] FDA and has been authorized for detection and/or diagnosis of SARS-CoV-2 by FDA under an Emergency Use Authorization (EUA). This EUA will remain in effect (meaning this test can be used) for the duration of the COVID-19 declaration under Section 564(b)(1) of the Act, 21 U.S.C. section 360bbb-3(b)(1), unless the authorization is terminated or revoked.  Performed at [facility], [address] [place], [place] [address]     Labs: CBC: Recent Labs  Lab 05/20/22 1528 05/21/22 0506  WBC 10.8* 9.0  NEUTROABS 8.9*  --   HGB 14.8 15.0  HCT 45.8 47.2  MCV 97.4 99.6  PLT 270 212   Basic Metabolic Panel: Recent Labs  Lab 05/20/22 1528 05/21/22 0506  NA 138 140  K 4.0 4.8  CL 105 109  CO2 18* 25  GLUCOSE 119* 154*  BUN 33* 31*  CREATININE 0.90 0.88  CALCIUM 9.0 8.6*   Liver Function Tests: Recent Labs  Lab 05/20/22 1528 05/21/22 0506  AST 18 21  ALT 17 15  ALKPHOS 69  73  BILITOT 1.3* 0.9  PROT 7.7 7.1  ALBUMIN 3.8 3.3*   CBG: No results for input(s): "GLUCAP" in the last 168 hours.  Discharge time spent: greater than 30 minutes.  This record has been created using Systems analyst. Errors have been sought and corrected,but may not always be located. Such creation errors do not reflect on the standard of care.   Signed: Lorella Nimrod, MD [facility] 05/21/2022

## 2022-05-21 NOTE — ED Notes (Signed)
Pt discharge to home. Pt VSS, GCS 15, NAD. Pt verbalized understanding of discharge instructions with no additional questions at this time.  

## 2022-05-21 NOTE — Consult Note (Addendum)
Subjective:     Matthew Peters is a 66 y.o. male who I was asked to see in consultation for evaluation of a left submandibular mass. The patient reports that the mass has been present for 2 days. The onset of the mass was sudden. Associated symptoms were positive for pain, enlargement, tenderness with eating, difficulty swallowing, difficulty turning head.  Patient has a known history of head and neck cancer, and sees Dr. Amada Jupiter at [facility].  He came in last night to the ED at [facility] with significant painful left neck swelling.  After being admitted and treated with antibiotics and steroids, he is noticing significant improvement in his symptoms.  He never had difficulty breathing, and CT scan was obtained by the ED last night showing some nonspecific diffuse inflammation in the left neck  Past Medical History:  Diagnosis Date   Squamous cell carcinoma of trachea (HCC)    Past Surgical History:  Procedure Laterality Date   GASTROSTOMY TUBE PLACEMENT     No current facility-administered medications on file prior to encounter.   Current Outpatient Medications on File Prior to Encounter  Medication Sig Dispense Refill   acetaminophen (TYLENOL) 325 MG tablet Take 650 mg by mouth every 4 (four) hours as needed.     levothyroxine (SYNTHROID) 75 MCG tablet Take 1 tablet by mouth daily.     mirtazapine (REMERON) 30 MG tablet Take 30 mg by mouth at bedtime.     pentoxifylline (TRENTAL) 400 MG CR tablet Take 400 mg by mouth 3 (three) times daily.     Vitamin E (VITAMIN E/D-ALPHA NATURAL) 268 MG (400 UNIT) CAPS Take 1 tablet by mouth daily.     Allergies  Allergen Reactions   Sulfa Antibiotics Anaphylaxis   Social History   Socioeconomic History   Marital status: Married    Spouse name: Not on file   Number of children: Not on file   Years of education: Not on file   Highest education level: Not on file  Occupational History   Not on file  Tobacco Use   Smoking status: Every Day     Packs/day: 0.15    Years: 50.00    Total pack years: 7.50    Types: Cigarettes   Smokeless tobacco: Never  Substance and Sexual Activity   Alcohol use: No   Drug use: No   Sexual activity: Not Currently  Other Topics Concern   Not on file  Social History Narrative   Not on file   Social Determinants of Health   Financial Resource Strain: Not on file  Food Insecurity: Not on file  Transportation Needs: Not on file  Physical Activity: Not on file  Stress: Not on file  Social Connections: Not on file  Intimate Partner Violence: Not on file      Review of Systems Pertinent items are noted in HPI.     Objective:    BP 109/76   Pulse (!) 54   Temp 98 F (36.7 C)   Resp 15   Ht '5\' 5"'$  (1.651 m)   Wt 56.7 kg   SpO2 97%   BMI 20.80 kg/m   General:   healthy  Head and Face:   no scars, lesions or masses  External Ears:   normal pinnae shape and position  Ext. Aud. Canal:  Right:patent   Left: patent   Tympanic Mem:  Right: normal landmarks and mobility  Left: normal landmarks and mobility  Nose:  Nares normal. Septum midline. Mucosa  normal. No drainage or sinus tenderness., no discharge  Oropharynx:  Dry oral pharynx, no lesions  Tonsils:  Atrophic from radiation  Post. Pharynx:  Radiation edema and xerostomia  Neck:  Firm edema of the left submandibular neck consistent with radiation effect and salivary gland inflammation  Thyroid:   Normal     Medical decision making: I reviewed patient CT scan of the neck with contrast obtained 05/20/2022:.  I do not see any concerning masses or lesions.  There is soft tissue stranding in the left neck which is likely a combination of soft gland inflammation and treatment effect from radiation.  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  He was having difficulty swallowing and  handling secretions at the time, but never never had difficulty breathing.  He is already feeling much better today after overnight hydration, antibiotics and steroids.  I suspect much of his issue was a combination of radiation side effects and dehydration leading to salivary gland inflammation.   Plan:   1.  Would recommend patient be discharged home on antibiotics and Medrol Dosepak with advised to continue hydration 2.  Would recommend patient contact his cancer provider Dr. Amada Jupiter for follow-up

## 2022-05-21 NOTE — ED Notes (Signed)
Pt eating breakfast at this time.  

## 2022-05-21 NOTE — ED Notes (Signed)
Assumed care from [place], [place]. Pt resting comfortably in bed at this time. Pt denies any current needs or questions. Call light with in reach.

## 2022-05-21 NOTE — ED Notes (Signed)
MD Reesa Chew notified patient ate all his lunch.

## 2022-05-21 NOTE — ED Notes (Signed)
Pt eating lunch at this time. Family at bedside. Pt denies any needs or concerns.

## 2022-05-21 NOTE — ED Notes (Signed)
MD Amin at bedside.

## 2022-05-23 DIAGNOSIS — K1120 Sialoadenitis, unspecified: Secondary | ICD-10-CM | POA: Diagnosis not present

## 2022-05-23 DIAGNOSIS — J020 Streptococcal pharyngitis: Secondary | ICD-10-CM | POA: Diagnosis not present

## 2022-05-23 DIAGNOSIS — E860 Dehydration: Secondary | ICD-10-CM | POA: Diagnosis not present

## 2022-06-07 DIAGNOSIS — Z6821 Body mass index (BMI) 21.0-21.9, adult: Secondary | ICD-10-CM | POA: Diagnosis not present

## 2022-06-07 DIAGNOSIS — R627 Adult failure to thrive: Secondary | ICD-10-CM | POA: Diagnosis not present

## 2022-06-07 DIAGNOSIS — E43 Unspecified severe protein-calorie malnutrition: Secondary | ICD-10-CM | POA: Diagnosis not present

## 2022-06-07 DIAGNOSIS — R1310 Dysphagia, unspecified: Secondary | ICD-10-CM | POA: Diagnosis not present

## 2022-06-07 DIAGNOSIS — C109 Malignant neoplasm of oropharynx, unspecified: Secondary | ICD-10-CM | POA: Diagnosis not present

## 2022-06-07 DIAGNOSIS — R918 Other nonspecific abnormal finding of lung field: Secondary | ICD-10-CM | POA: Diagnosis not present

## 2022-06-07 DIAGNOSIS — E039 Hypothyroidism, unspecified: Secondary | ICD-10-CM | POA: Diagnosis not present

## 2022-06-07 DIAGNOSIS — F32A Depression, unspecified: Secondary | ICD-10-CM | POA: Diagnosis not present

## 2022-06-07 DIAGNOSIS — C760 Malignant neoplasm of head, face and neck: Secondary | ICD-10-CM | POA: Diagnosis not present

## 2022-06-07 DIAGNOSIS — Z85819 Personal history of malignant neoplasm of unspecified site of lip, oral cavity, and pharynx: Secondary | ICD-10-CM | POA: Diagnosis not present

## 2022-06-07 DIAGNOSIS — K1120 Sialoadenitis, unspecified: Secondary | ICD-10-CM | POA: Diagnosis not present

## 2022-06-07 DIAGNOSIS — Z923 Personal history of irradiation: Secondary | ICD-10-CM | POA: Diagnosis not present

## 2022-06-07 DIAGNOSIS — Z882 Allergy status to sulfonamides status: Secondary | ICD-10-CM | POA: Diagnosis not present

## 2022-06-07 DIAGNOSIS — Z87891 Personal history of nicotine dependence: Secondary | ICD-10-CM | POA: Diagnosis not present

## 2022-06-07 DIAGNOSIS — R634 Abnormal weight loss: Secondary | ICD-10-CM | POA: Diagnosis not present

## 2022-06-07 DIAGNOSIS — R0989 Other specified symptoms and signs involving the circulatory and respiratory systems: Secondary | ICD-10-CM | POA: Diagnosis not present

## 2022-06-07 DIAGNOSIS — Z7952 Long term (current) use of systemic steroids: Secondary | ICD-10-CM | POA: Diagnosis not present

## 2022-06-07 DIAGNOSIS — Z85818 Personal history of malignant neoplasm of other sites of lip, oral cavity, and pharynx: Secondary | ICD-10-CM | POA: Diagnosis not present

## 2022-06-15 NOTE — Progress Notes (Signed)
Patient initially said sore throat and difficulty breathing which would be symptomatic of covid

## 2022-07-03 DIAGNOSIS — C760 Malignant neoplasm of head, face and neck: Secondary | ICD-10-CM | POA: Diagnosis not present

## 2022-07-07 DIAGNOSIS — C109 Malignant neoplasm of oropharynx, unspecified: Secondary | ICD-10-CM | POA: Diagnosis not present

## 2022-07-07 DIAGNOSIS — E039 Hypothyroidism, unspecified: Secondary | ICD-10-CM | POA: Diagnosis not present

## 2022-07-07 DIAGNOSIS — R1310 Dysphagia, unspecified: Secondary | ICD-10-CM | POA: Diagnosis not present

## 2022-08-08 ENCOUNTER — Emergency Department: Payer: Medicare Other

## 2022-08-08 ENCOUNTER — Inpatient Hospital Stay
Admission: EM | Admit: 2022-08-08 | Discharge: 2022-08-10 | DRG: 322 | Disposition: A | Discharge status: Home / Self Care | Payer: Medicare Other | Attending: Internal Medicine | Admitting: Internal Medicine

## 2022-08-08 ENCOUNTER — Encounter: Payer: Self-pay | Admitting: Internal Medicine

## 2022-08-08 ENCOUNTER — Inpatient Hospital Stay: Payer: Medicare Other

## 2022-08-08 DIAGNOSIS — Z79899 Other long term (current) drug therapy: Secondary | ICD-10-CM

## 2022-08-08 DIAGNOSIS — I358 Other nonrheumatic aortic valve disorders: Secondary | ICD-10-CM | POA: Diagnosis not present

## 2022-08-08 DIAGNOSIS — R627 Adult failure to thrive: Secondary | ICD-10-CM | POA: Diagnosis present

## 2022-08-08 DIAGNOSIS — R490 Dysphonia: Secondary | ICD-10-CM | POA: Diagnosis present

## 2022-08-08 DIAGNOSIS — R7989 Other specified abnormal findings of blood chemistry: Secondary | ICD-10-CM | POA: Insufficient documentation

## 2022-08-08 DIAGNOSIS — E782 Mixed hyperlipidemia: Secondary | ICD-10-CM | POA: Diagnosis not present

## 2022-08-08 DIAGNOSIS — Z8249 Family history of ischemic heart disease and other diseases of the circulatory system: Secondary | ICD-10-CM | POA: Diagnosis not present

## 2022-08-08 DIAGNOSIS — Z8512 Personal history of malignant neoplasm of trachea: Secondary | ICD-10-CM | POA: Diagnosis not present

## 2022-08-08 DIAGNOSIS — F17200 Nicotine dependence, unspecified, uncomplicated: Secondary | ICD-10-CM

## 2022-08-08 DIAGNOSIS — Z882 Allergy status to sulfonamides status: Secondary | ICD-10-CM | POA: Diagnosis not present

## 2022-08-08 DIAGNOSIS — R1310 Dysphagia, unspecified: Secondary | ICD-10-CM | POA: Diagnosis present

## 2022-08-08 DIAGNOSIS — I214 Non-ST elevation (NSTEMI) myocardial infarction: Secondary | ICD-10-CM | POA: Diagnosis not present

## 2022-08-08 DIAGNOSIS — Z6821 Body mass index (BMI) 21.0-21.9, adult: Secondary | ICD-10-CM

## 2022-08-08 DIAGNOSIS — Z923 Personal history of irradiation: Secondary | ICD-10-CM

## 2022-08-08 DIAGNOSIS — M7989 Other specified soft tissue disorders: Secondary | ICD-10-CM | POA: Diagnosis not present

## 2022-08-08 DIAGNOSIS — Z79890 Hormone replacement therapy (postmenopausal): Secondary | ICD-10-CM

## 2022-08-08 DIAGNOSIS — I071 Rheumatic tricuspid insufficiency: Secondary | ICD-10-CM | POA: Diagnosis present

## 2022-08-08 DIAGNOSIS — I2510 Atherosclerotic heart disease of native coronary artery without angina pectoris: Secondary | ICD-10-CM | POA: Diagnosis not present

## 2022-08-08 DIAGNOSIS — R9389 Abnormal findings on diagnostic imaging of other specified body structures: Secondary | ICD-10-CM

## 2022-08-08 DIAGNOSIS — Z931 Gastrostomy status: Secondary | ICD-10-CM

## 2022-08-08 DIAGNOSIS — R001 Bradycardia, unspecified: Secondary | ICD-10-CM | POA: Diagnosis not present

## 2022-08-08 DIAGNOSIS — Z85819 Personal history of malignant neoplasm of unspecified site of lip, oral cavity, and pharynx: Secondary | ICD-10-CM

## 2022-08-08 DIAGNOSIS — E785 Hyperlipidemia, unspecified: Secondary | ICD-10-CM

## 2022-08-08 DIAGNOSIS — E039 Hypothyroidism, unspecified: Secondary | ICD-10-CM | POA: Diagnosis not present

## 2022-08-08 DIAGNOSIS — R0789 Other chest pain: Secondary | ICD-10-CM | POA: Diagnosis not present

## 2022-08-08 DIAGNOSIS — Z9221 Personal history of antineoplastic chemotherapy: Secondary | ICD-10-CM

## 2022-08-08 DIAGNOSIS — F32A Depression, unspecified: Secondary | ICD-10-CM | POA: Diagnosis not present

## 2022-08-08 DIAGNOSIS — F17210 Nicotine dependence, cigarettes, uncomplicated: Secondary | ICD-10-CM | POA: Diagnosis not present

## 2022-08-08 DIAGNOSIS — R079 Chest pain, unspecified: Secondary | ICD-10-CM | POA: Diagnosis not present

## 2022-08-08 HISTORY — DX: Disorder of thyroid, unspecified: E07.9

## 2022-08-08 HISTORY — DX: Adult failure to thrive: R62.7

## 2022-08-08 LAB — CBC WITH DIFFERENTIAL/PLATELET
Abs Immature Granulocytes: 0.01 10*3/uL (ref 0.00–0.07)
Basophils Absolute: 0 10*3/uL (ref 0.0–0.1)
Basophils Relative: 0 %
Eosinophils Absolute: 0.3 10*3/uL (ref 0.0–0.5)
Eosinophils Relative: 4 %
HCT: 45.4 % (ref 39.0–52.0)
Hemoglobin: 14.8 g/dL (ref 13.0–17.0)
Immature Granulocytes: 0 %
Lymphocytes Relative: 18 %
Lymphs Abs: 1.3 10*3/uL (ref 0.7–4.0)
MCH: 32.2 pg (ref 26.0–34.0)
MCHC: 32.6 g/dL (ref 30.0–36.0)
MCV: 98.9 fL (ref 80.0–100.0)
Monocytes Absolute: 0.7 10*3/uL (ref 0.1–1.0)
Monocytes Relative: 10 %
Neutro Abs: 4.8 10*3/uL (ref 1.7–7.7)
Neutrophils Relative %: 68 %
Platelets: 269 10*3/uL (ref 150–400)
RBC: 4.59 MIL/uL (ref 4.22–5.81)
RDW: 15.9 % — ABNORMAL HIGH (ref 11.5–15.5)
WBC: 7 10*3/uL (ref 4.0–10.5)
nRBC: 0 % (ref 0.0–0.2)

## 2022-08-08 LAB — TROPONIN I (HIGH SENSITIVITY)
Troponin I (High Sensitivity): 356 ng/L (ref <18)
Troponin I (High Sensitivity): 508 ng/L (ref <18)

## 2022-08-08 LAB — COMPREHENSIVE METABOLIC PANEL
ALT: 20 U/L (ref 0–44)
AST: 24 U/L (ref 15–41)
Albumin: 4.1 g/dL (ref 3.5–5.0)
Alkaline Phosphatase: 65 U/L (ref 38–126)
Anion gap: 7 (ref 5–15)
BUN: 27 mg/dL — ABNORMAL HIGH (ref 8–23)
CO2: 25 mmol/L (ref 22–32)
Calcium: 8.8 mg/dL — ABNORMAL LOW (ref 8.9–10.3)
Chloride: 108 mmol/L (ref 98–111)
Creatinine, Ser: 0.96 mg/dL (ref 0.61–1.24)
GFR, Estimated: >60 mL/min (ref >60)
Glucose, Bld: 101 mg/dL — ABNORMAL HIGH (ref 70–99)
Potassium: 3.8 mmol/L (ref 3.5–5.1)
Sodium: 140 mmol/L (ref 135–145)
Total Bilirubin: 0.5 mg/dL (ref 0.3–1.2)
Total Protein: 7.2 g/dL (ref 6.5–8.1)

## 2022-08-08 LAB — D-DIMER, QUANTITATIVE: D-Dimer, Quant: <0.27 ug/mL-FEU (ref 0.00–0.50)

## 2022-08-08 LAB — PROTIME-INR
INR: 1.1 (ref 0.8–1.2)
Prothrombin Time: 13.6 seconds (ref 11.4–15.2)

## 2022-08-08 LAB — APTT: aPTT: 28 seconds (ref 24–36)

## 2022-08-08 MED ORDER — SODIUM CHLORIDE 0.9 % IV SOLN: INTRAVENOUS | Status: AC

## 2022-08-08 MED ORDER — SODIUM CHLORIDE 0.9% FLUSH
3.0000 mL | Freq: Two times a day (BID) | INTRAVENOUS | Status: DC
  Administered 2022-08-09 (×2): 3 mL via INTRAVENOUS

## 2022-08-08 MED ORDER — LEVOTHYROXINE SODIUM 50 MCG PO TABS
75.0000 ug | ORAL_TABLET | Freq: Every day | ORAL | Status: DC
  Administered 2022-08-09 – 2022-08-10 (×2): 75 ug via ORAL
  Filled 2022-08-08 (×2): qty 1

## 2022-08-08 MED ORDER — ASPIRIN 81 MG PO CHEW
81.0000 mg | CHEWABLE_TABLET | ORAL | Status: AC
  Administered 2022-08-09: 81 mg via ORAL
  Filled 2022-08-08: qty 1

## 2022-08-08 MED ORDER — ATORVASTATIN CALCIUM 20 MG PO TABS
40.0000 mg | ORAL_TABLET | Freq: Every day | ORAL | Status: DC
  Administered 2022-08-08 – 2022-08-10 (×3): 40 mg via ORAL
  Filled 2022-08-08 (×3): qty 2

## 2022-08-08 MED ORDER — SODIUM CHLORIDE 0.9% FLUSH: 3.0000 mL | INTRAVENOUS | Status: DC | PRN

## 2022-08-08 MED ORDER — SODIUM CHLORIDE 0.9 % IV SOLN: 250.0000 mL | INTRAVENOUS | Status: DC | PRN

## 2022-08-08 MED ORDER — SODIUM CHLORIDE 0.9% FLUSH
3.0000 mL | Freq: Two times a day (BID) | INTRAVENOUS | Status: DC
  Administered 2022-08-08 – 2022-08-09 (×3): 3 mL via INTRAVENOUS

## 2022-08-08 MED ORDER — ACETAMINOPHEN 325 MG PO TABS: 650.0000 mg | ORAL_TABLET | Freq: Four times a day (QID) | ORAL | Status: DC | PRN

## 2022-08-08 MED ORDER — ACETAMINOPHEN 650 MG RE SUPP: 650.0000 mg | Freq: Four times a day (QID) | RECTAL | Status: DC | PRN

## 2022-08-08 MED ORDER — METOPROLOL SUCCINATE ER 25 MG PO TB24
25.0000 mg | ORAL_TABLET | Freq: Every day | ORAL | Status: DC
  Administered 2022-08-08: 25 mg via ORAL
  Filled 2022-08-08 (×3): qty 1

## 2022-08-08 MED ORDER — LABETALOL HCL 5 MG/ML IV SOLN: 20.0000 mg | INTRAVENOUS | Status: DC | PRN

## 2022-08-08 MED ORDER — HEPARIN (PORCINE) 25000 UT/250ML-% IV SOLN
850.0000 [IU]/h | INTRAVENOUS | Status: DC
  Administered 2022-08-08: 650 [IU]/h via INTRAVENOUS
  Filled 2022-08-08: qty 250

## 2022-08-08 MED ORDER — HEPARIN BOLUS VIA INFUSION
3400.0000 [IU] | Freq: Once | INTRAVENOUS | Status: AC
  Administered 2022-08-08: 3400 [IU] via INTRAVENOUS
  Filled 2022-08-08: qty 3400

## 2022-08-08 NOTE — Assessment & Plan Note (Signed)
This is likely due to above, however due to association with chest x-ray abnormality is noted, I am going to check a D-dimer and lower extremity Dopplers to rule out venous thromboembolism, continue with heparin infusion as already ordered for NSTEMI

## 2022-08-08 NOTE — ED Notes (Signed)
Provider Otilio Miu, NP notified regarding pt trop: 71

## 2022-08-08 NOTE — Assessment & Plan Note (Signed)
Patient received aspirin for today via EMS.  Already getting aspirin for tomorrow.  Patient is scheduled for coronary angiogram.  I will start the patient on metoprolol as well as atorvastatin.  Lipid panel is pending hemoglobin A1c is pending.  We will target a blood pressure of under [id number] mm systolic patient aware of diagnosis

## 2022-08-08 NOTE — H&P (Signed)
History and Physical    Patient: Matthew Peters [id number] DOB: 1956-03-24 DOA: 08/08/2022 DOS: the patient was seen and examined on 08/08/2022 PCP: Cletis Athens, MD  Patient coming from: Home  Chief Complaint:  Chief Complaint  Patient presents with   Chest Pain   HPI: Matthew Peters is a 67 y.o. male was in his usual state of health till about 48 hours ago.  Over the last 2 days patient reports on and off right anterior chest area pain/discomfort that he further does not describe except as indigestion like without any precipitating or relieving factors without any aggravating factors it was radiating towards the right arm.  There has been no change in the location of the pain.  Not associated with presyncope or palpitation or sensation of shortness of breath no leg swelling or cramping no fever or cough.  Patient reports that the pain was initially mild to moderate lasting a minute or 2 to 10 to 15 minutes at a time occurring several times a day.  However today there were several episodes that were pretty severe prompting the patient to come to the hospital.  Patient received aspirin via EMS, is currently totally pain-free and therefore totally asymptomatic. Review of Systems: As mentioned in the history of present illness. All other systems reviewed and are negative. Past Medical History:  Diagnosis Date   Squamous cell carcinoma of trachea (HCC)    Past Surgical History:  Procedure Laterality Date   GASTROSTOMY TUBE PLACEMENT     Social History:  reports that he has been smoking cigarettes. He has a 7.50 pack-year smoking history. He has never used smokeless tobacco. He reports that he does not drink alcohol and does not use drugs.  Allergies  Allergen Reactions   Sulfa Antibiotics Anaphylaxis    No family history on file.  Prior to Admission medications   Medication Sig Start Date End Date Taking? Authorizing Provider  acetaminophen (TYLENOL) 325 MG tablet Take 650 mg by  mouth every 4 (four) hours as needed. 09/03/20  Yes [provider]  levothyroxine (SYNTHROID) 75 MCG tablet Take 1 tablet by mouth daily. 02/27/22 02/27/23 Yes [provider]  Vitamin E (VITAMIN E/D-ALPHA NATURAL) 268 MG (400 UNIT) CAPS Take 1 tablet by mouth daily. 01/18/22 01/18/23 Yes [provider]  mirtazapine (REMERON) 30 MG tablet Take 30 mg by mouth at bedtime. Patient not taking: Reported on 08/08/2022 05/21/20   [provider]  oxyCODONE (OXY IR/ROXICODONE) 5 MG immediate release tablet Take 5 mg by mouth every 6 (six) hours as needed. Patient not taking: Reported on 08/08/2022 06/12/22   [provider]  pentoxifylline (TRENTAL) 400 MG CR tablet Take 400 mg by mouth 3 (three) times daily. Patient not taking: Reported on 08/08/2022 05/27/20   [provider]  predniSONE (STERAPRED UNI-PAK 21 TAB) 10 MG (21) TBPK tablet Take 4 tablets daily for next 2 days, then decrease 1 tablet every other day until you complete the package Patient not taking: Reported on 08/08/2022 05/21/22   Lorella Nimrod, MD    Physical Exam: Vitals:   08/08/22 1700 08/08/22 1730 08/08/22 1800 08/08/22 1914  BP: (!) 161/100 (!) 152/92 (!) 159/92   Pulse: 72 66 (!) 58 66  Resp: (!) '21 18 15 14  '$ Temp:      TempSrc:      SpO2: 98% 96% 98% 96%  Weight:      Height:       General: Patient is alert awake coherent  gives history himself, has slight dysphonia I believe from his previous prior laryngeal surgery otherwise no distress Respiratory exam: Bilateral intravesicular Cardiovascular exam S1-S2 normal Exam abdominal exam there is a gastrostomy tube in situ, patient advises that he still takes oral meals. Extremities warm without edema Skin no rash Data Reviewed:  Labs on Admission:  Results for orders placed or performed during the hospital encounter of 08/08/22 (from the past 24 hour(s))  CBC with Differential     Status: Abnormal   Collection Time: 08/08/22   5:03 PM  Result Value Ref Range   WBC 7.0 4.0 - 10.5 K/uL   RBC 4.59 4.22 - 5.81 MIL/uL   Hemoglobin 14.8 13.0 - 17.0 g/dL   HCT 45.4 39.0 - 52.0 %   MCV 98.9 80.0 - 100.0 fL   MCH 32.2 26.0 - 34.0 pg   MCHC 32.6 30.0 - 36.0 g/dL   RDW 15.9 (H) 11.5 - 15.5 %   Platelets 269 150 - 400 K/uL   nRBC 0.0 0.0 - 0.2 %   Neutrophils Relative % 68 %   Neutro Abs 4.8 1.7 - 7.7 K/uL   Lymphocytes Relative 18 %   Lymphs Abs 1.3 0.7 - 4.0 K/uL   Monocytes Relative 10 %   Monocytes Absolute 0.7 0.1 - 1.0 K/uL   Eosinophils Relative 4 %   Eosinophils Absolute 0.3 0.0 - 0.5 K/uL   Basophils Relative 0 %   Basophils Absolute 0.0 0.0 - 0.1 K/uL   Immature Granulocytes 0 %   Abs Immature Granulocytes 0.01 0.00 - 0.07 K/uL  Comprehensive metabolic panel     Status: Abnormal   Collection Time: 08/08/22  5:03 PM  Result Value Ref Range   Sodium 140 135 - 145 mmol/L   Potassium 3.8 3.5 - 5.1 mmol/L   Chloride 108 98 - 111 mmol/L   CO2 25 22 - 32 mmol/L   Glucose, Bld 101 (H) 70 - 99 mg/dL   BUN 27 (H) 8 - 23 mg/dL   Creatinine, Ser 0.96 0.61 - 1.24 mg/dL   Calcium 8.8 (L) 8.9 - 10.3 mg/dL   Total Protein 7.2 6.5 - 8.1 g/dL   Albumin 4.1 3.5 - 5.0 g/dL   AST 24 15 - 41 U/L   ALT 20 0 - 44 U/L   Alkaline Phosphatase 65 38 - 126 U/L   Total Bilirubin 0.5 0.3 - 1.2 mg/dL   GFR, Estimated >60 >60 mL/min   Anion gap 7 5 - 15  Troponin I (High Sensitivity)     Status: Abnormal   Collection Time: 08/08/22  5:03 PM  Result Value Ref Range   Troponin I (High Sensitivity) 356 (HH) <18 ng/L  Protime-INR     Status: None   Collection Time: 08/08/22  5:03 PM  Result Value Ref Range   Prothrombin Time 13.6 11.4 - 15.2 seconds   INR 1.1 0.8 - 1.2  APTT     Status: None   Collection Time: 08/08/22  5:03 PM  Result Value Ref Range   aPTT 28 24 - 36 seconds      Radiological Exams on Admission:  DG Chest Portable 1 View  Result Date: 08/08/2022 CLINICAL DATA:  Chest pain EXAM: PORTABLE CHEST 1  VIEW COMPARISON:  April 09, 2006 FINDINGS: Mild opacity in the lateral right base is linear consistent with scar atelectasis. Mild opacity in the left base also identified with lateral linear old components. The heart, hila, mediastinum, lungs, and pleura are otherwise  unremarkable. IMPRESSION: Opacity in left base could represent atelectasis or infiltrate/pneumonia. Recommend short-term follow-up to ensure resolution. Scarring in the bases laterally. Electronically Signed   By: Dorise Bullion III M.D.   On: 08/08/2022 17:37    EKG: Independently reviewed. NSR      Assessment and Plan: * NSTEMI (non-ST elevated myocardial infarction) [facility]) Patient received aspirin for today via EMS.  Already getting aspirin for tomorrow.  Patient is scheduled for coronary angiogram.  I will start the patient on metoprolol as well as atorvastatin.  Lipid panel is pending hemoglobin A1c is pending.  We will target a blood pressure of under [id number] mm systolic patient aware of diagnosis  Abnormal CXR Patient will need CAT scan I believe, we will wait for D-dimer to decide whether to do it with contrast or without.  Troponin I above reference range This is likely due to above, however due to association with chest x-ray abnormality is noted, I am going to check a D-dimer and lower extremity Dopplers to rule out venous thromboembolism, continue with heparin infusion as already ordered for NSTEMI      Advance Care Planning:   Code Status: Prior full code  Consults: Dr. Saunders Revel of cardiology engaged by the ER attending. Thank you.  Family Communication: family at bedside  Severity of Illness: The appropriate patient status for this patient is INPATIENT. Inpatient status is judged to be reasonable and necessary in order to provide the required intensity of service to ensure the patient's safety. The patient's presenting symptoms, physical exam findings, and initial radiographic and laboratory data in the context of  their chronic comorbidities is felt to place them at high risk for further clinical deterioration. Furthermore, it is not anticipated that the patient will be medically stable for discharge from the hospital within 2 midnights of admission.   * I certify that at the point of admission it is my clinical judgment that the patient will require inpatient hospital care spanning beyond 2 midnights from the point of admission due to high intensity of service, high risk for further deterioration and high frequency of surveillance required.*  Author: Gertie Fey, MD 08/08/2022 7:38 PM  For on call review [email or web address].

## 2022-08-08 NOTE — ED Provider Notes (Signed)
[facility] Provider Note    Event Date/Time   First MD Initiated Contact with Patient 08/08/22 1657     (approximate)   History   Chest Pain   HPI  Matthew Peters is a 67 y.o. male with history of squamous cell carcinoma of the head and neck as well as thyroid issues who comes in with concern for chest pain.  Patient reports 2 days of intermittent chest pain that kind of comes and goes.  Reports that the chest pain comes in intermittent times.  He does not report that it is just exertional in nature.  Denies any shortness of breath, abdominal pain or any other concerns peer denies any falls hitting his head or bleeding issues.  He states he just feels like reflux but it does radiate down his right arm which had a more worried which is why he came into the ER to be evaluated.  Denies any radiation to his back or numbness or tingling.  Physical Exam   Triage Vital Signs: ED Triage Vitals  Enc Vitals Group     BP --      Pulse Rate 08/08/22 1655 70     Resp 08/08/22 1655 18     Temp --      Temp src --      SpO2 08/08/22 1655 98 %     Weight 08/08/22 1654 125 lb (56.7 kg)     Height 08/08/22 1654 '5\' 5"'$  (1.651 m)     Head Circumference --      Peak Flow --      Pain Score 08/08/22 1654 2     Pain Loc --      Pain Edu? --      Excl. in [place]? --     Most recent vital signs: Vitals:   08/08/22 1655  Pulse: 70  Resp: 18  SpO2: 98%     General: Awake, no distress.  CV:  Good peripheral perfusion.  Resp:  Normal effort.  Abd:  No distention.  Soft and nontender Other:  Good equal radial pulses.   ED Results / Procedures / Treatments   Labs (all labs ordered are listed, but only abnormal results are displayed) Labs Reviewed  CBC WITH DIFFERENTIAL/PLATELET - Abnormal; Notable for the following components:      Result Value   RDW 15.9 (*)    All other components within normal limits  COMPREHENSIVE METABOLIC PANEL - Abnormal; Notable for the  following components:   Glucose, Bld 101 (*)    BUN 27 (*)    Calcium 8.8 (*)    All other components within normal limits  TROPONIN I (HIGH SENSITIVITY) - Abnormal; Notable for the following components:   Troponin I (High Sensitivity) 356 (*)    All other components within normal limits  PROTIME-INR  APTT     EKG  My interpretation of EKG:  EKG is sinus rate of 68 without any ST elevation but diffuse ST depressions, normal intervals  Repeat posterior EKG shows sinus rate of 67 with continued similar ST depressions but now no elevation noted in V4 V5 and V6 to suggest any posterior stroke  RADIOLOGY I have reviewed the xray personally and interpreted and there was some concern for atelectasis or infiltrate in the left base.  But patient did not really have symptoms of pneumonia  PROCEDURES:  Critical Care performed: Yes, see critical care procedure note(s)  .1-3 Lead EKG Interpretation  Performed by: Jari Pigg,  Royetta Crochet, MD Authorized by: Vanessa Glendive, MD     Interpretation: normal     ECG rate:  60   ECG rate assessment: normal     Rhythm: sinus rhythm     Ectopy: none     Conduction: normal   .Critical Care  Performed by: Vanessa Camp Swift, MD Authorized by: Vanessa Monte Sereno, MD   Critical care provider statement:    Critical care time (minutes):  30   Critical care was necessary to treat or prevent imminent or life-threatening deterioration of the following conditions:  Circulatory failure   Critical care was time spent personally by me on the following activities:  Development of treatment plan with patient or surrogate, discussions with consultants, evaluation of patient's response to treatment, examination of patient, ordering and review of laboratory studies, ordering and review of radiographic studies, ordering and performing treatments and interventions, pulse oximetry, re-evaluation of patient's condition and review of old charts    MEDICATIONS ORDERED IN  ED: Medications  heparin bolus via infusion 3,400 Units (has no administration in time range)    Followed by  heparin ADULT infusion 100 units/mL (25000 units/257m) (has no administration in time range)     IMPRESSION / MDM / ASSESSMENT AND PLAN / ED COURSE  I reviewed the triage vital signs and the nursing notes.   Patient's presentation is most consistent with acute presentation with potential threat to life or bodily function.   Patient comes in with concerns for chest pain.  Differential is ACS.  No shortness of breath to suggest PE.  No symptoms concerning for dissection.  No abdominal discomfort.  EKG was concerning  5:21 PM  Discussed with Dr. ESaunders Revelfrom cardiology.  Does not meet STEMI criteria.  This time patient is chest pain-free  Patient's labs show reassuring CBC, CMP but troponin is elevated into the 300s. 6:10 PM reevaluated patient continues to be chest pain-free so patient was placed on heparin.  If patient has significantly increasing pain or changes in EKG could consider more urgent catheterization but at this time given chest pain-free and no STEMI on EKG patient will be treated as an NSTEMI.  Will discuss the hospital team for admission   The patient is on the cardiac monitor to evaluate for evidence of arrhythmia and/or significant heart rate changes.      FINAL CLINICAL IMPRESSION(S) / ED DIAGNOSES   Final diagnoses:  NSTEMI (non-ST elevated myocardial infarction) ([place]     Rx / DC Orders   ED Discharge Orders     None        Note:  This document was prepared using Dragon voice recognition software and may include unintentional dictation errors.   FVanessa [place] MD 08/08/22 [phone]

## 2022-08-08 NOTE — Progress Notes (Signed)
ANTICOAGULATION CONSULT NOTE - Initial Consult  Pharmacy Consult for Heparin Infusion  Indication: chest pain/ACS  Allergies  Allergen Reactions   Sulfa Antibiotics Anaphylaxis    Patient Measurements: Height: '5\' 5"'$  (165.1 cm) Weight: 56.7 kg (125 lb) IBW/kg (Calculated) : 61.5  Vital Signs: Temp: 98 F (36.7 C) (03/05 1655) Temp Source: Oral (03/05 1655) BP: 159/92 (03/05 1800) Pulse Rate: 58 (03/05 1800)  Labs: Recent Labs    08/08/22 1703  HGB 14.8  HCT 45.4  PLT 269  APTT 28  LABPROT 13.6  INR 1.1  CREATININE 0.96  TROPONINIHS 356*    Estimated Creatinine Clearance: 59.9 mL/min (by C-G formula based on SCr of 0.96 mg/dL).   Medical History: Past Medical History:  Diagnosis Date   Squamous cell carcinoma of trachea [facility])      Assessment: 67 yo male seen in ED for chest pain. Pharmacy has been consulted for heparin infusion dosing and monitoring for NSTEMI/ACS.  3/5 @ 1703 Troponin I: 356  Goal of Therapy:  Heparin level 0.3-0.7 units/ml Monitor platelets by anticoagulation protocol: Yes   Plan:  Give 3400 units bolus x 1 Start heparin infusion at 650 units/hr Check anti-Xa level in 6 hours and daily while on heparin Continue to monitor H&H and platelets  Pernell Dupre, PharmD, BCPS Clinical Pharmacist 08/08/2022 6:11 PM

## 2022-08-08 NOTE — ED Triage Notes (Signed)
Pt presents to the ED via [facility] due to CP. Pt states the pain has had intermittent CP for two days. Pt denies SOB. Pt denies NVD. Pt A&Ox4

## 2022-08-08 NOTE — H&P (View-Only) (Signed)
Cardiology Consultation   Patient ID: Matthew Peters MRN: [id number]; DOB: 01/26/1956  Admit date: 08/08/2022 Date of Consult: 08/08/2022  PCP:  Cletis [place], [place] [facility] Cardiologist:  New - Natausha Jungwirth   Patient Profile:   Matthew Peters is a 67 y.o. male with a hx of oral cancer status post chemoradiation complicated by dysphagia and failure to thrive with placement of G-tube in 06/2021 as well as thyroid disease who is being seen 08/08/2022 for the evaluation of chest pain and abnormal EKG at the request of Dr. Jari Pigg.  History of Present Illness:   Matthew Peters was in his usual state of health until yesterday afternoon when he began experiencing intermittent chest discomfort.  He initially thought it was indigestion, though on further questioning, he reports a pressure or tightness sensation in the center of his chest radiating to the right chest and right arm.  It has been accompanied by some tingling in the right arm as well as nausea and diaphoresis.  The discomfort seems to improve when he takes a deep breath and gets worse when he ambulates.  Due to continued pain, he came to the ER today, where EKG showed inferolateral ST depressions.  Matthew Peters is currently chest pain-free.  He denies a history of heart disease and prior cardiac testing.  He has not had any edema or leg pain.  He notes that his cancer has been in remission for several years after completing chemoradiation.    Past Medical History:  Diagnosis Date   Failure to thrive in adult    s/p G-tube 06/2022 at [facility]   Squamous cell carcinoma of trachea [facility])    Thyroid disease     Past Surgical History:  Procedure Laterality Date   GASTROSTOMY TUBE PLACEMENT       Home Medications:  Prior to Admission medications   Medication Sig Start Date Lorenso Quirino Date Taking? Authorizing Provider  acetaminophen (TYLENOL) 325 MG tablet Take 650 mg by mouth every 4 (four) hours as needed. 09/03/20  Yes [provider]  levothyroxine (SYNTHROID) 75 MCG tablet Take 1 tablet by mouth daily. 02/27/22 02/27/23 Yes [provider]  Vitamin E (VITAMIN E/D-ALPHA NATURAL) 268 MG (400 UNIT) CAPS Take 1 tablet by mouth daily. 01/18/22 01/18/23 Yes [provider]  mirtazapine (REMERON) 30 MG tablet Take 30 mg by mouth at bedtime. Patient not taking: Reported on 08/08/2022 05/21/20   [provider]  oxyCODONE (OXY IR/ROXICODONE) 5 MG immediate release tablet Take 5 mg by mouth every 6 (six) hours as needed. Patient not taking: Reported on 08/08/2022 06/12/22   [provider]  pentoxifylline (TRENTAL) 400 MG CR tablet Take 400 mg by mouth 3 (three) times daily. Patient not taking: Reported on 08/08/2022 05/27/20   [provider]  predniSONE (STERAPRED UNI-PAK 21 TAB) 10 MG (21) TBPK tablet Take 4 tablets daily for next 2 days, then decrease 1 tablet every other day until you complete the package Patient not taking: Reported on 08/08/2022 05/21/22   Lorella Nimrod, MD    Inpatient Medications: Scheduled Meds:  [START ON 08/09/2022] aspirin  81 mg Oral Pre-Cath   atorvastatin  40 mg Oral Daily   metoprolol succinate  25 mg Oral Daily   sodium chloride flush  3 mL Intravenous Q12H   Continuous Infusions:  sodium chloride     [START ON 08/09/2022] sodium chloride     heparin 650 Units/hr (08/08/22 1832)   PRN Meds:  Allergies:    Allergies  Allergen Reactions   Sulfa Antibiotics Anaphylaxis    Social History:   Social History   Tobacco Use   Smoking status: Former    Packs/day: 1.50    Years: 50.00    Total pack years: 75.00    Types: Cigarettes    Quit date: 06/2022    Years since quitting: 0.1   Smokeless tobacco: Never  Substance Use Topics   Alcohol use: No   Drug use: No     Family History:   Family History  Problem Relation Age of Onset   Heart disease Mother        multiple stent beginning in her mid-70's     ROS:  Please see the history  of present illness. All other ROS reviewed and negative.     Physical Exam/Data:   Vitals:   08/08/22 1930 08/08/22 1945 08/08/22 2009 08/08/22 2019  BP: (!) 152/83  (!) 150/79   Pulse: (!) 56 61 (!) 59 62  Resp: '19 14 14   '$ Temp:      TempSrc:      SpO2: 96% 96% 97%   Weight:      Height:       No intake or output data in the 24 hours ending 08/08/22 2022    08/08/2022    4:54 PM 05/20/2022   11:02 PM 02/22/2021   11:41 AM  Last 3 Weights  Weight (lbs) 125 lb 125 lb 129 lb 1.6 oz  Weight (kg) 56.7 kg 56.7 kg 58.559 kg     Body mass index is 20.8 kg/m.  General:  Well nourished, well developed, in no acute distress.  His wife is at the bedside. HEENT: normal Neck: no JVD Vascular: No carotid bruits; 2+ right radial pulse. Cardiac:  normal S1, S2; RRR; no murmurs, rubs, or gallops Lungs:  clear to auscultation bilaterally, no wheezing, rhonchi or rales  Abd: soft, nontender, no hepatomegaly.  Gastrostomy tube is present. Ext: no edema Musculoskeletal:  No deformities, BUE and BLE strength normal and equal Skin: warm and dry Psych:  Normal affect   EKG:  The EKG was personally reviewed and demonstrates: Sinus rhythm with inferolateral ST depression. Telemetry:  Telemetry was personally reviewed and demonstrates: Normal sinus rhythm  Relevant CV Studies: Carotid Doppler (11/25/2020): Minimal heterogeneous plaque with no hemodynamically significant stenosis in the extracranial cerebrovascular circulation.  Laboratory Data:  High Sensitivity Troponin:   Recent Labs  Lab 08/08/22 1703 08/08/22 1919  TROPONINIHS 356* 508*     Chemistry Recent Labs  Lab 08/08/22 1703  NA 140  K 3.8  CL 108  CO2 25  GLUCOSE 101*  BUN 27*  CREATININE 0.96  CALCIUM 8.8*  GFRNONAA >60  ANIONGAP 7    Recent Labs  Lab 08/08/22 1703  PROT 7.2  ALBUMIN 4.1  AST 24  ALT 20  ALKPHOS 65  BILITOT 0.5   Lipids No results for input(s): "CHOL", "TRIG", "HDL", "LABVLDL", "LDLCALC",  "CHOLHDL" in the last 168 hours.  Hematology Recent Labs  Lab 08/08/22 1703  WBC 7.0  RBC 4.59  HGB 14.8  HCT 45.4  MCV 98.9  MCH 32.2  MCHC 32.6  RDW 15.9*  PLT 269   Thyroid No results for input(s): "TSH", "FREET4" in the last 168 hours.  BNPNo results for input(s): "BNP", "PROBNP" in the last 168 hours.  DDimer  Recent Labs  Lab 08/08/22 1703  DDIMER <0.27     Radiology/Studies:  DG Chest  Portable 1 View  Result Date: 08/08/2022 CLINICAL DATA:  Chest pain EXAM: PORTABLE CHEST 1 VIEW COMPARISON:  April 09, 2006 FINDINGS: Mild opacity in the lateral right base is linear consistent with scar atelectasis. Mild opacity in the left base also identified with lateral linear old components. The heart, hila, mediastinum, lungs, and pleura are otherwise unremarkable. IMPRESSION: Opacity in left base could represent atelectasis or infiltrate/pneumonia. Recommend short-term follow-up to ensure resolution. Scarring in the bases laterally. Electronically Signed   By: Dorise Bullion III M.D.   On: 08/08/2022 17:37     Assessment and Plan:   Non-ST elevation myocardial infarction: Matthew Peters presents with a 1 to 2-day history of intermittent chest pain.  While it is atypical, his EKG demonstrates notable inferolateral ST depressions.  His troponins are also uptrending, albeit without ongoing chest pain.  Presentation is consistent with NSTEMI. -Repeat EKG for recurrent chest pain. -Continue heparin infusion. -Continue aspirin 81 mg daily. -Continue atorvastatin 40 mg daily. -Agree with metoprolol succinate 25 mg daily. -Trend high-sensitivity troponin I until it has peaked, then stop. -Obtain echocardiogram. -Proceed with left heart catheterization and possible PCI tomorrow (sooner if patient develops refractory chest pain or develops ST elevation on EKG in the meantime). -Check lipid panel and hemoglobin A1c  Head and neck cancer and failure to thrive: Patient reports cancer is  currently in remission though he has struggled with dysphagia, leading to gastrostomy tube placement earlier this year. -No urgent issues at this time.  Ongoing management per primary team.  Shared Decision Making/Informed Consent The risks [stroke (1 in 1000), death (1 in 1000), kidney failure [usually temporary] (1 in 500), bleeding (1 in 200), allergic reaction [possibly serious] (1 in 200)], benefits (diagnostic support and management of coronary artery disease) and alternatives of a cardiac catheterization were discussed in detail with Matthew Peters and he is willing to proceed.  Risk Assessment/Risk Scores:     TIMI Risk Score for Unstable Angina or Non-ST Elevation MI:   The patient's TIMI risk score is 5, which indicates a 26% risk of all cause mortality, new or recurrent myocardial infarction or need for urgent revascularization in the next 14 days.    For questions or updates, please contact [place] Please consult [email or web address] for contact info under [facility] Cardiology.  Signed, Nelva Bush, MD  08/08/2022 8:22 PM

## 2022-08-08 NOTE — Consult Note (Signed)
Cardiology Consultation   Patient ID: Matthew Peters MRN: [id number]; DOB: May 20, 1956  Admit date: 08/08/2022 Date of Consult: 08/08/2022  PCP:  Cletis Athens, [place] [facility] Cardiologist:  New - Andrew Blasius   Patient Profile:   Matthew Peters is a 67 y.o. male with a hx of oral cancer status post chemoradiation complicated by dysphagia and failure to thrive with placement of G-tube in 06/2021 as well as thyroid disease who is being seen 08/08/2022 for the evaluation of chest pain and abnormal EKG at the request of Dr. Jari Pigg.  History of Present Illness:   Matthew Peters was in his usual state of health until yesterday afternoon when he began experiencing intermittent chest discomfort.  He initially thought it was indigestion, though on further questioning, he reports a pressure or tightness sensation in the center of his chest radiating to the right chest and right arm.  It has been accompanied by some tingling in the right arm as well as nausea and diaphoresis.  The discomfort seems to improve when he takes a deep breath and gets worse when he ambulates.  Due to continued pain, he came to the ER today, where EKG showed inferolateral ST depressions.  Matthew Peters is currently chest pain-free.  He denies a history of heart disease and prior cardiac testing.  He has not had any edema or leg pain.  He notes that his cancer has been in remission for several years after completing chemoradiation.    Past Medical History:  Diagnosis Date   Failure to thrive in adult    s/p G-tube 06/2022 at [facility]   Squamous cell carcinoma of trachea [facility])    Thyroid disease     Past Surgical History:  Procedure Laterality Date   GASTROSTOMY TUBE PLACEMENT       Home Medications:  Prior to Admission medications   Medication Sig Start Date Matthew Peters Date Taking? Authorizing Provider  acetaminophen (TYLENOL) 325 MG tablet Take 650 mg by mouth every 4 (four) hours as needed. 09/03/20  Yes [provider]  levothyroxine (SYNTHROID) 75 MCG tablet Take 1 tablet by mouth daily. 02/27/22 02/27/23 Yes [provider]  Vitamin E (VITAMIN E/D-ALPHA NATURAL) 268 MG (400 UNIT) CAPS Take 1 tablet by mouth daily. 01/18/22 01/18/23 Yes [provider]  mirtazapine (REMERON) 30 MG tablet Take 30 mg by mouth at bedtime. Patient not taking: Reported on 08/08/2022 05/21/20   [provider]  oxyCODONE (OXY IR/ROXICODONE) 5 MG immediate release tablet Take 5 mg by mouth every 6 (six) hours as needed. Patient not taking: Reported on 08/08/2022 06/12/22   [provider]  pentoxifylline (TRENTAL) 400 MG CR tablet Take 400 mg by mouth 3 (three) times daily. Patient not taking: Reported on 08/08/2022 05/27/20   [provider]  predniSONE (STERAPRED UNI-PAK 21 TAB) 10 MG (21) TBPK tablet Take 4 tablets daily for next 2 days, then decrease 1 tablet every other day until you complete the package Patient not taking: Reported on 08/08/2022 05/21/22   Lorella Nimrod, MD    Inpatient Medications: Scheduled Meds:  [START ON 08/09/2022] aspirin  81 mg Oral Pre-Cath   atorvastatin  40 mg Oral Daily   metoprolol succinate  25 mg Oral Daily   sodium chloride flush  3 mL Intravenous Q12H   Continuous Infusions:  sodium chloride     [START ON 08/09/2022] sodium chloride     heparin 650 Units/hr (08/08/22 1832)   PRN Meds:  Allergies:    Allergies  Allergen Reactions   Sulfa Antibiotics Anaphylaxis    Social History:   Social History   Tobacco Use   Smoking status: Former    Packs/day: 1.50    Years: 50.00    Total pack years: 75.00    Types: Cigarettes    Quit date: 06/2022    Years since quitting: 0.1   Smokeless tobacco: Never  Substance Use Topics   Alcohol use: No   Drug use: No     Family History:   Family History  Problem Relation Age of Onset   Heart disease Mother        multiple stent beginning in her mid-70's     ROS:  Please see the history  of present illness. All other ROS reviewed and negative.     Physical Exam/Data:   Vitals:   08/08/22 1930 08/08/22 1945 08/08/22 2009 08/08/22 2019  BP: (!) 152/83  (!) 150/79   Pulse: (!) 56 61 (!) 59 62  Resp: '19 14 14   '$ Temp:      TempSrc:      SpO2: 96% 96% 97%   Weight:      Height:       No intake or output data in the 24 hours ending 08/08/22 2022    08/08/2022    4:54 PM 05/20/2022   11:02 PM 02/22/2021   11:41 AM  Last 3 Weights  Weight (lbs) 125 lb 125 lb 129 lb 1.6 oz  Weight (kg) 56.7 kg 56.7 kg 58.559 kg     Body mass index is 20.8 kg/m.  General:  Well nourished, well developed, in no acute distress.  His wife is at the bedside. HEENT: normal Neck: no JVD Vascular: No carotid bruits; 2+ right radial pulse. Cardiac:  normal S1, S2; RRR; no murmurs, rubs, or gallops Lungs:  clear to auscultation bilaterally, no wheezing, rhonchi or rales  Abd: soft, nontender, no hepatomegaly.  Gastrostomy tube is present. Ext: no edema Musculoskeletal:  No deformities, BUE and BLE strength normal and equal Skin: warm and dry Psych:  Normal affect   EKG:  The EKG was personally reviewed and demonstrates: Sinus rhythm with inferolateral ST depression. Telemetry:  Telemetry was personally reviewed and demonstrates: Normal sinus rhythm  Relevant CV Studies: Carotid Doppler (11/25/2020): Minimal heterogeneous plaque with no hemodynamically significant stenosis in the extracranial cerebrovascular circulation.  Laboratory Data:  High Sensitivity Troponin:   Recent Labs  Lab 08/08/22 1703 08/08/22 1919  TROPONINIHS 356* 508*     Chemistry Recent Labs  Lab 08/08/22 1703  NA 140  K 3.8  CL 108  CO2 25  GLUCOSE 101*  BUN 27*  CREATININE 0.96  CALCIUM 8.8*  GFRNONAA >60  ANIONGAP 7    Recent Labs  Lab 08/08/22 1703  PROT 7.2  ALBUMIN 4.1  AST 24  ALT 20  ALKPHOS 65  BILITOT 0.5   Lipids No results for input(s): "CHOL", "TRIG", "HDL", "LABVLDL", "LDLCALC",  "CHOLHDL" in the last 168 hours.  Hematology Recent Labs  Lab 08/08/22 1703  WBC 7.0  RBC 4.59  HGB 14.8  HCT 45.4  MCV 98.9  MCH 32.2  MCHC 32.6  RDW 15.9*  PLT 269   Thyroid No results for input(s): "TSH", "FREET4" in the last 168 hours.  BNPNo results for input(s): "BNP", "PROBNP" in the last 168 hours.  DDimer  Recent Labs  Lab 08/08/22 1703  DDIMER <0.27     Radiology/Studies:  DG Chest  Portable 1 View  Result Date: 08/08/2022 CLINICAL DATA:  Chest pain EXAM: PORTABLE CHEST 1 VIEW COMPARISON:  April 09, 2006 FINDINGS: Mild opacity in the lateral right base is linear consistent with scar atelectasis. Mild opacity in the left base also identified with lateral linear old components. The heart, hila, mediastinum, lungs, and pleura are otherwise unremarkable. IMPRESSION: Opacity in left base could represent atelectasis or infiltrate/pneumonia. Recommend short-term follow-up to ensure resolution. Scarring in the bases laterally. Electronically Signed   By: Dorise Bullion III M.D.   On: 08/08/2022 17:37     Assessment and Plan:   Non-ST elevation myocardial infarction: Mr. Nezat presents with a 1 to 2-day history of intermittent chest pain.  While it is atypical, his EKG demonstrates notable inferolateral ST depressions.  His troponins are also uptrending, albeit without ongoing chest pain.  Presentation is consistent with NSTEMI. -Repeat EKG for recurrent chest pain. -Continue heparin infusion. -Continue aspirin 81 mg daily. -Continue atorvastatin 40 mg daily. -Agree with metoprolol succinate 25 mg daily. -Trend high-sensitivity troponin I until it has peaked, then stop. -Obtain echocardiogram. -Proceed with left heart catheterization and possible PCI tomorrow (sooner if patient develops refractory chest pain or develops ST elevation on EKG in the meantime). -Check lipid panel and hemoglobin A1c  Head and neck cancer and failure to thrive: Patient reports cancer is  currently in remission though he has struggled with dysphagia, leading to gastrostomy tube placement earlier this year. -No urgent issues at this time.  Ongoing management per primary team.  Shared Decision Making/Informed Consent The risks [stroke (1 in 1000), death (1 in 1000), kidney failure [usually temporary] (1 in 500), bleeding (1 in 200), allergic reaction [possibly serious] (1 in 200)], benefits (diagnostic support and management of coronary artery disease) and alternatives of a cardiac catheterization were discussed in detail with Mr. Saupe and he is willing to proceed.  Risk Assessment/Risk Scores:     TIMI Risk Score for Unstable Angina or Non-ST Elevation MI:   The patient's TIMI risk score is 5, which indicates a 26% risk of all cause mortality, new or recurrent myocardial infarction or need for urgent revascularization in the next 14 days.    For questions or updates, please contact [place] Please consult [email or web address] for contact info under [facility] Cardiology.  Signed, Nelva Bush, MD  08/08/2022 8:22 PM

## 2022-08-08 NOTE — Assessment & Plan Note (Signed)
Patient will need CAT scan I believe, we will wait for D-dimer to decide whether to do it with contrast or without.

## 2022-08-09 ENCOUNTER — Other Ambulatory Visit: Payer: Self-pay

## 2022-08-09 ENCOUNTER — Other Ambulatory Visit (HOSPITAL_COMMUNITY): Payer: Self-pay

## 2022-08-09 ENCOUNTER — Encounter
Admission: EM | Disposition: A | Discharge status: Home / Self Care | Payer: Self-pay | Source: Home / Self Care | Attending: Internal Medicine

## 2022-08-09 ENCOUNTER — Inpatient Hospital Stay (HOSPITAL_COMMUNITY)
Admit: 2022-08-09 | Discharge: 2022-08-09 | Disposition: A | Discharge status: Home / Self Care | Payer: Medicare Other | Attending: Internal Medicine | Admitting: Internal Medicine

## 2022-08-09 DIAGNOSIS — I2510 Atherosclerotic heart disease of native coronary artery without angina pectoris: Secondary | ICD-10-CM

## 2022-08-09 DIAGNOSIS — I214 Non-ST elevation (NSTEMI) myocardial infarction: Secondary | ICD-10-CM

## 2022-08-09 HISTORY — PX: CORONARY STENT INTERVENTION: CATH118234

## 2022-08-09 HISTORY — PX: LEFT HEART CATH AND CORONARY ANGIOGRAPHY: CATH118249

## 2022-08-09 LAB — BASIC METABOLIC PANEL
Anion gap: 8 (ref 5–15)
BUN: 26 mg/dL — ABNORMAL HIGH (ref 8–23)
CO2: 22 mmol/L (ref 22–32)
Calcium: 8.3 mg/dL — ABNORMAL LOW (ref 8.9–10.3)
Chloride: 110 mmol/L (ref 98–111)
Creatinine, Ser: 0.81 mg/dL (ref 0.61–1.24)
GFR, Estimated: >60 mL/min (ref >60)
Glucose, Bld: 104 mg/dL — ABNORMAL HIGH (ref 70–99)
Potassium: 3.7 mmol/L (ref 3.5–5.1)
Sodium: 140 mmol/L (ref 135–145)

## 2022-08-09 LAB — CBC
HCT: 39.3 % (ref 39.0–52.0)
Hemoglobin: 12.9 g/dL — ABNORMAL LOW (ref 13.0–17.0)
MCH: 32 pg (ref 26.0–34.0)
MCHC: 32.8 g/dL (ref 30.0–36.0)
MCV: 97.5 fL (ref 80.0–100.0)
Platelets: 237 10*3/uL (ref 150–400)
RBC: 4.03 MIL/uL — ABNORMAL LOW (ref 4.22–5.81)
RDW: 15.9 % — ABNORMAL HIGH (ref 11.5–15.5)
WBC: 7.8 10*3/uL (ref 4.0–10.5)
nRBC: 0 % (ref 0.0–0.2)

## 2022-08-09 LAB — ECHOCARDIOGRAM COMPLETE
AR max vel: 2.14 cm2
AV Area VTI: 2.23 cm2
AV Area mean vel: 1.91 cm2
AV Mean grad: 4 mmHg
AV Peak grad: 7.4 mmHg
Ao pk vel: 1.36 m/s
Area-P 1/2: 3.83 cm2
Height: 67 in
S' Lateral: 2.8 cm
Weight: 2148.16 oz

## 2022-08-09 LAB — TROPONIN I (HIGH SENSITIVITY)
Troponin I (High Sensitivity): 964 ng/L (ref <18)
Troponin I (High Sensitivity): 1130 ng/L (ref <18)
Troponin I (High Sensitivity): 1417 ng/L (ref <18)
Troponin I (High Sensitivity): 1532 ng/L (ref <18)
Troponin I (High Sensitivity): 1570 ng/L (ref <18)
Troponin I (High Sensitivity): 2331 ng/L (ref <18)

## 2022-08-09 LAB — LIPID PANEL
Cholesterol: 167 mg/dL (ref 0–200)
HDL: 58 mg/dL (ref >40)
LDL Cholesterol: 83 mg/dL (ref 0–99)
Total CHOL/HDL Ratio: 2.9 RATIO
Triglycerides: 130 mg/dL (ref <150)
VLDL: 26 mg/dL (ref 0–40)

## 2022-08-09 LAB — HIV ANTIBODY (ROUTINE TESTING W REFLEX): HIV Screen 4th Generation wRfx: NONREACTIVE

## 2022-08-09 LAB — HEPARIN LEVEL (UNFRACTIONATED)
Heparin Unfractionated: <0.1 IU/mL — ABNORMAL LOW (ref 0.30–0.70)
Heparin Unfractionated: 0.15 IU/mL — ABNORMAL LOW (ref 0.30–0.70)

## 2022-08-09 SURGERY — LEFT HEART CATH AND CORONARY ANGIOGRAPHY
Anesthesia: Moderate Sedation

## 2022-08-09 MED ORDER — FENTANYL CITRATE (PF) 100 MCG/2ML IJ SOLN
INTRAMUSCULAR | Status: AC
  Filled 2022-08-09: qty 2

## 2022-08-09 MED ORDER — IOHEXOL 300 MG/ML  SOLN
INTRAMUSCULAR | Status: DC | PRN
  Administered 2022-08-09: 108 mL

## 2022-08-09 MED ORDER — NITROGLYCERIN 1 MG/10 ML FOR IR/CATH LAB
INTRA_ARTERIAL | Status: AC
  Filled 2022-08-09: qty 10

## 2022-08-09 MED ORDER — VERAPAMIL HCL 2.5 MG/ML IV SOLN
INTRAVENOUS | Status: DC | PRN
  Administered 2022-08-09 (×2): 2.5 mg via INTRA_ARTERIAL

## 2022-08-09 MED ORDER — MIDAZOLAM HCL 2 MG/2ML IJ SOLN
INTRAMUSCULAR | Status: AC
  Filled 2022-08-09: qty 2

## 2022-08-09 MED ORDER — CLOPIDOGREL BISULFATE 75 MG PO TABS
75.0000 mg | ORAL_TABLET | Freq: Every day | ORAL | Status: DC
  Administered 2022-08-10: 75 mg via ORAL
  Filled 2022-08-09: qty 1

## 2022-08-09 MED ORDER — HEPARIN (PORCINE) IN NACL 1000-0.9 UT/500ML-% IV SOLN
INTRAVENOUS | Status: DC | PRN
  Administered 2022-08-09 (×2): 500 mL

## 2022-08-09 MED ORDER — HEPARIN SODIUM (PORCINE) 1000 UNIT/ML IJ SOLN
INTRAMUSCULAR | Status: AC
  Filled 2022-08-09: qty 10

## 2022-08-09 MED ORDER — FAMOTIDINE 20 MG PO TABS
ORAL_TABLET | ORAL | Status: DC | PRN
  Administered 2022-08-09: 20 mg via ORAL

## 2022-08-09 MED ORDER — ASPIRIN 81 MG PO TBEC
81.0000 mg | DELAYED_RELEASE_TABLET | Freq: Every day | ORAL | Status: DC
  Administered 2022-08-10: 81 mg via ORAL
  Filled 2022-08-09: qty 1

## 2022-08-09 MED ORDER — SODIUM CHLORIDE 0.9 % IV SOLN: 250.0000 mL | INTRAVENOUS | Status: DC | PRN

## 2022-08-09 MED ORDER — ENOXAPARIN SODIUM 40 MG/0.4ML IJ SOSY
40.0000 mg | PREFILLED_SYRINGE | INTRAMUSCULAR | Status: DC
  Administered 2022-08-10: 40 mg via SUBCUTANEOUS
  Filled 2022-08-09: qty 0.4

## 2022-08-09 MED ORDER — SODIUM CHLORIDE 0.9 % IV SOLN: INTRAVENOUS | Status: AC

## 2022-08-09 MED ORDER — HYDRALAZINE HCL 20 MG/ML IJ SOLN: 10.0000 mg | INTRAMUSCULAR | Status: AC | PRN

## 2022-08-09 MED ORDER — HEPARIN (PORCINE) IN NACL 1000-0.9 UT/500ML-% IV SOLN
INTRAVENOUS | Status: AC
  Filled 2022-08-09: qty 1000

## 2022-08-09 MED ORDER — HEPARIN SODIUM (PORCINE) 1000 UNIT/ML IJ SOLN
INTRAMUSCULAR | Status: DC | PRN
  Administered 2022-08-09: 1000 [IU] via INTRAVENOUS
  Administered 2022-08-09 (×2): 3000 [IU] via INTRAVENOUS
  Administered 2022-08-09: 2500 [IU] via INTRAVENOUS

## 2022-08-09 MED ORDER — SODIUM CHLORIDE 0.9% FLUSH
3.0000 mL | Freq: Two times a day (BID) | INTRAVENOUS | Status: DC
  Administered 2022-08-09: 3 mL via INTRAVENOUS

## 2022-08-09 MED ORDER — LABETALOL HCL 5 MG/ML IV SOLN: 10.0000 mg | INTRAVENOUS | Status: AC | PRN

## 2022-08-09 MED ORDER — SODIUM CHLORIDE 0.9% FLUSH: 3.0000 mL | INTRAVENOUS | Status: DC | PRN

## 2022-08-09 MED ORDER — CLOPIDOGREL BISULFATE 300 MG PO TABS
ORAL_TABLET | ORAL | Status: AC
  Filled 2022-08-09: qty 2

## 2022-08-09 MED ORDER — FENTANYL CITRATE (PF) 100 MCG/2ML IJ SOLN
INTRAMUSCULAR | Status: DC | PRN
  Administered 2022-08-09: 25 ug via INTRAVENOUS

## 2022-08-09 MED ORDER — HEPARIN BOLUS VIA INFUSION
1800.0000 [IU] | Freq: Once | INTRAVENOUS | Status: AC
  Administered 2022-08-09: 1800 [IU] via INTRAVENOUS
  Filled 2022-08-09: qty 1800

## 2022-08-09 MED ORDER — CLOPIDOGREL BISULFATE 75 MG PO TABS
ORAL_TABLET | ORAL | Status: DC | PRN
  Administered 2022-08-09: 600 mg via ORAL

## 2022-08-09 MED ORDER — HEPARIN (PORCINE) 25000 UT/250ML-% IV SOLN
1000.0000 [IU]/h | INTRAVENOUS | Status: DC
  Administered 2022-08-09: 1000 [IU]/h via INTRAVENOUS

## 2022-08-09 MED ORDER — VERAPAMIL HCL 2.5 MG/ML IV SOLN
INTRAVENOUS | Status: AC
  Filled 2022-08-09: qty 2

## 2022-08-09 MED ORDER — FAMOTIDINE 20 MG PO TABS
ORAL_TABLET | ORAL | Status: AC
  Filled 2022-08-09: qty 1

## 2022-08-09 MED ORDER — HEPARIN BOLUS VIA INFUSION
1900.0000 [IU] | Freq: Once | INTRAVENOUS | Status: AC
  Administered 2022-08-09: 1900 [IU] via INTRAVENOUS
  Filled 2022-08-09: qty 1900

## 2022-08-09 MED ORDER — NITROGLYCERIN 1 MG/10 ML FOR IR/CATH LAB
INTRA_ARTERIAL | Status: DC | PRN
  Administered 2022-08-09: 100 ug via INTRACORONARY
  Administered 2022-08-09: 200 ug via INTRACORONARY
  Administered 2022-08-09: 100 ug via INTRACORONARY

## 2022-08-09 MED ORDER — MIDAZOLAM HCL 2 MG/2ML IJ SOLN
INTRAMUSCULAR | Status: DC | PRN
  Administered 2022-08-09: 1 mg via INTRAVENOUS

## 2022-08-09 SURGICAL SUPPLY — 19 items
BALLN EUPHORA RX 2.0X12 (BALLOONS) ×1
BALLN ~~LOC~~ EUPHORA RX 2.75X20 (BALLOONS) ×1
BALLOON EUPHORA RX 2.0X12 (BALLOONS) IMPLANT
BALLOON ~~LOC~~ EUPHORA RX 2.75X20 (BALLOONS) IMPLANT
CATH 5F 110X4 TIG (CATHETERS) IMPLANT
CATH LAUNCHER 5F EBU3.5 (CATHETERS) IMPLANT
DEVICE RAD TR BAND REGULAR (VASCULAR PRODUCTS) IMPLANT
DRAPE BRACHIAL (DRAPES) IMPLANT
GLIDESHEATH SLEND SS 6F .021 (SHEATH) IMPLANT
GUIDEWIRE INQWIRE 1.5J.035X260 (WIRE) IMPLANT
INQWIRE 1.5J .035X260CM (WIRE) ×2
KIT ENCORE 26 ADVANTAGE (KITS) IMPLANT
PACK CARDIAC CATH (CUSTOM PROCEDURE TRAY) ×1 IMPLANT
PROTECTION STATION PRESSURIZED (MISCELLANEOUS) ×1
SET ATX-X65L (MISCELLANEOUS) IMPLANT
STATION PROTECTION PRESSURIZED (MISCELLANEOUS) IMPLANT
STENT ONYX FRONTIER 2.5X08 (Permanent Stent) IMPLANT
STENT ONYX FRONTIER 2.75X26 (Permanent Stent) IMPLANT
WIRE RUNTHROUGH .014X180CM (WIRE) IMPLANT

## 2022-08-09 NOTE — TOC Initial Note (Signed)
Transition of Care [facility]) - Initial/Assessment Note    Patient Details  Name: Matthew Peters MRN: [id number] Date of Birth: 01-06-1956  Transition of Care [facility]) CM/SW Contact:    Laurena Slimmer, RN Phone Number: 08/09/2022, 2:06 PM  Clinical Narrative:                  Transition of Care [facility]) Screening Note   Patient Details  Name: Matthew Peters Date of Birth: 09/19/1955   Transition of Care [facility]) CM/SW Contact:    Laurena Slimmer, RN Phone Number: 08/09/2022, 2:06 PM    Transition of Care Department [facility]) has reviewed patient and no TOC needs have been identified at this time. We will continue to monitor patient advancement through interdisciplinary progression rounds. If new patient transition needs arise, please place a TOC consult.          Patient Goals and CMS Choice            Expected Discharge Plan and Services                                              Prior Living Arrangements/Services                       Activities of Daily Living Home Assistive Devices/Equipment: None ADL Screening (condition at time of admission) Patient's cognitive ability adequate to safely complete daily activities?: Yes Is the patient deaf or have difficulty hearing?: Yes Does the patient have difficulty seeing, even when wearing glasses/contacts?: No Does the patient have difficulty concentrating, remembering, or making decisions?: No Patient able to express need for assistance with ADLs?: No Does the patient have difficulty dressing or bathing?: No Independently performs ADLs?: Yes (appropriate for developmental age) Does the patient have difficulty walking or climbing stairs?: No Weakness of Legs: None Weakness of Arms/Hands: None  Permission Sought/Granted                  Emotional Assessment              Admission diagnosis:  NSTEMI (non-ST elevated myocardial infarction) [facility]) [I21.4] Patient Active Problem List   Diagnosis Date  Noted   NSTEMI (non-ST elevated myocardial infarction) ([place]) 08/08/2022   Troponin I above reference range 08/08/2022   Abnormal CXR 08/08/2022   Sialoadenitis 05/20/2022   Known medical problems 05/20/2022   Smoker 02/22/2021   Right carotid bruit 11/04/2020   Annual physical exam 06/11/2020   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 06/11/2020   Major depressive disorder with single episode, in full remission ([place]) 07/30/2018   Drug-induced constipation 11/13/2017   Weight loss 10/26/2017   Squamous cell carcinoma of head and neck ([place]) 10/21/2017   PCP:  Cletis Athens, MD Pharmacy:   [facility] [address] [place] [address] Phone: [phone] Fax: [phone]  [facility] ([place]) - [place], [place] [facility] [address] [address] Phone: [phone] Fax: [phone]     Social Determinants of Health (SDOH) Social History: SDOH Screenings   Food Insecurity: No Food Insecurity (08/09/2022)  Housing: Low Risk  (08/09/2022)  Transportation Needs: No Transportation Needs (08/09/2022)  Utilities: Not At Risk (08/09/2022)  Depression (PHQ2-9): Low Risk  (02/22/2021)  Tobacco Use: Medium  Risk (08/08/2022)   SDOH Interventions:     Readmission Risk Interventions     No data to display

## 2022-08-09 NOTE — TOC Benefit Eligibility Note (Signed)
Patient Teacher, English as a foreign language completed.    The patient is currently admitted and upon discharge could be taking Brilinta 90 mg.  The current 30 day co-pay is $47.00.   The patient is currently admitted and upon discharge could be taking clopidogrel (Plavix) 75 mg.  The current 30 day co-pay is $0.00.   The patient is insured through [employer] part D   Lyndel Safe, [place] Patient Advocate Specialist [place] Patient Advocate Team Direct Number: [phone]  Fax: [phone]

## 2022-08-09 NOTE — Interval H&P Note (Signed)
History and Physical Interval Note:  08/09/2022 1:58 PM  Matthew Peters  has presented today for surgery, with the diagnosis of NSTEMI.  The various methods of treatment have been discussed with the patient and family. After consideration of risks, benefits and other options for treatment, the patient has consented to  Procedure(s): LEFT HEART CATH AND CORONARY ANGIOGRAPHY (N/A) as a surgical intervention.  The patient's history has been reviewed, patient examined, no change in status, stable for surgery.  I have reviewed the patient's chart and labs.  Questions were answered to the patient's satisfaction.    Cath Lab Visit (complete for each Cath Lab visit)  Clinical Evaluation Leading to the Procedure:   ACS: Yes.    Non-ACS:  N/A  Marci Polito

## 2022-08-09 NOTE — Progress Notes (Signed)
*  PRELIMINARY RESULTS* Echocardiogram 2D Echocardiogram has been performed.  Sherrie Sport 08/09/2022, 9:37 AM

## 2022-08-09 NOTE — Progress Notes (Signed)
Rounding Note    Patient Name: Matthew Peters Date of Encounter: 08/09/2022  [place] Cardiologist: None   Subjective   Denies chest pain or shortness of breath.  No acute events overnight.  Left heart cath scheduled for later today.  Inpatient Medications    Scheduled Meds:  atorvastatin  40 mg Oral Daily   levothyroxine  75 mcg Oral Q0600   metoprolol succinate  25 mg Oral Daily   sodium chloride flush  3 mL Intravenous Q12H   sodium chloride flush  3 mL Intravenous Q12H   Continuous Infusions:  sodium chloride     sodium chloride 75 mL/hr at 08/09/22 0700   heparin 1,000 Units/hr (08/09/22 1124)   PRN Meds: sodium chloride, acetaminophen **OR** acetaminophen, labetalol, sodium chloride flush   Vital Signs    Vitals:   08/08/22 2200 08/08/22 2300 08/09/22 0130 08/09/22 0823  BP:   131/73 124/73  Pulse:   66 (!) 59  Resp: '16 14 20 16  '$ Temp:   98.6 F (37 C) 98.1 F (36.7 C)  TempSrc:      SpO2:   96% 93%  Weight:      Height:        Intake/Output Summary (Last 24 hours) at 08/09/2022 1148 Last data filed at 08/09/2022 0900 Gross per 24 hour  Intake 635.55 ml  Output 450 ml  Net 185.55 ml      08/08/2022    9:17 PM 08/08/2022    4:54 PM 05/20/2022   11:02 PM  Last 3 Weights  Weight (lbs) 134 lb 4.2 oz 125 lb 125 lb  Weight (kg) 60.9 kg 56.7 kg 56.7 kg      Telemetry    Sinus rhythm, heart rate 64- Personally Reviewed  ECG      Physical Exam   GEN: No acute distress.   Neck: No JVD Cardiac: RRR  Respiratory: Diminished breath sounds bilaterally, no wheezing GI: Soft, nontender MS: No edema; No deformity. Neuro:  Nonfocal  Psych: Normal affect   Labs    High Sensitivity Troponin:   Recent Labs  Lab 08/08/22 2310 08/09/22 0205 08/09/22 0521 08/09/22 0823 08/09/22 1017  TROPONINIHS 964* 1,130* 1,417* 1,532* 1,570*     Chemistry Recent Labs  Lab 08/08/22 1703 08/09/22 0205  NA 140 140  K 3.8 3.7  CL 108 110  CO2 25  22  GLUCOSE 101* 104*  BUN 27* 26*  CREATININE 0.96 0.81  CALCIUM 8.8* 8.3*  PROT 7.2  --   ALBUMIN 4.1  --   AST 24  --   ALT 20  --   ALKPHOS 65  --   BILITOT 0.5  --   GFRNONAA >60 >60  ANIONGAP 7 8    Lipids  Recent Labs  Lab 08/09/22 0205  CHOL 167  TRIG 130  HDL 58  LDLCALC 83  CHOLHDL 2.9    Hematology Recent Labs  Lab 08/08/22 1703 08/09/22 0205  WBC 7.0 7.8  RBC 4.59 4.03*  HGB 14.8 12.9*  HCT 45.4 39.3  MCV 98.9 97.5  MCH 32.2 32.0  MCHC 32.6 32.8  RDW 15.9* 15.9*  PLT 269 237   Thyroid No results for input(s): "TSH", "FREET4" in the last 168 hours.  BNPNo results for input(s): "BNP", "PROBNP" in the last 168 hours.  DDimer  Recent Labs  Lab 08/08/22 1703  DDIMER <0.27     Radiology    US Venous Img Lower Bilateral (DVT)  Result Date:  08/08/2022 CLINICAL DATA:  Bilateral lower extremity swelling. EXAM: Bilateral LOWER EXTREMITY VENOUS DOPPLER ULTRASOUND TECHNIQUE: Gray-scale sonography with compression, as well as color and duplex ultrasound, were performed to evaluate the deep venous system(s) from the level of the common femoral vein through the popliteal and proximal calf veins. COMPARISON:  None Available. FINDINGS: VENOUS Normal compressibility of the common femoral, superficial femoral, and popliteal veins, as well as the visualized calf veins. Visualized portions of profunda femoral vein and great saphenous vein unremarkable. No filling defects to suggest DVT on grayscale or color Doppler imaging. Doppler waveforms show normal direction of venous flow, normal respiratory plasticity and response to augmentation. Limited views of the contralateral common femoral vein are unremarkable. OTHER None. Limitations: none IMPRESSION: Negative. Electronically Signed   By: Anner Crete M.D.   On: 08/08/2022 21:21   DG Chest Portable 1 View  Result Date: 08/08/2022 CLINICAL DATA:  Chest pain EXAM: PORTABLE CHEST 1 VIEW COMPARISON:  April 09, 2006  FINDINGS: Mild opacity in the lateral right base is linear consistent with scar atelectasis. Mild opacity in the left base also identified with lateral linear old components. The heart, hila, mediastinum, lungs, and pleura are otherwise unremarkable. IMPRESSION: Opacity in left base could represent atelectasis or infiltrate/pneumonia. Recommend short-term follow-up to ensure resolution. Scarring in the bases laterally. Electronically Signed   By: Dorise Bullion III M.D.   On: 08/08/2022 17:37    Cardiac Studies   TTE 08/07/2021 1. Left ventricular ejection fraction, by estimation, is 55 to 60%. The  left ventricle has normal function. The left ventricle has no regional  wall motion abnormalities. There is mild left ventricular hypertrophy.  Left ventricular diastolic parameters  were normal.   2. Right ventricular systolic function is normal. The right ventricular  size is normal.   3. The mitral valve is normal in structure. Mild mitral valve  regurgitation.   4. The aortic valve was not well visualized. Aortic valve regurgitation  is not visualized. Aortic valve sclerosis/calcification is present,  without any evidence of aortic stenosis.   5. The inferior vena cava is normal in size with greater than 50%  respiratory variability, suggesting right atrial pressure of 3 mmHg.   Patient Profile     67 y.o. male with history of oral cancer s/p chemo and radiation therapy, former smoker x 50+ years presenting with chest pain, diagnosed with NSTEMI.  Assessment & Plan    NSTEMI -Troponins upward trending, currently at 1570 -Aspirin, heparin drip -Toprol-XL for antianginal benefit -Echo with normal EF 55 to 60% -Plan left heart cath today, further recommendations pending left heart cath findings.      Signed, Kate Sable, MD  08/09/2022, 11:48 AM

## 2022-08-09 NOTE — Progress Notes (Signed)
PROGRESS NOTE    Matthew Peters  [id number] DOB: Oct 16, 1955 DOA: 08/08/2022 PCP: Matthew [place], MD    Brief Narrative:   67 y.o. male was in his usual state of health till about 48 hours ago.  Over the last 2 days patient reports on and off right anterior chest area pain/discomfort that he further does not describe except as indigestion like without any precipitating or relieving factors without any aggravating factors it was radiating towards the right arm.  There has been no change in the location of the pain.  Not associated with presyncope or palpitation or sensation of shortness of breath no leg swelling or cramping no fever or cough.  Patient reports that the pain was initially mild to moderate lasting a minute or 2 to 10 to 15 minutes at a time occurring several times a day.  However today there were several episodes that were pretty severe prompting the patient to come to the hospital.   Patient was admitted under presumptive diagnosis of NSTEMI.  Started on heparin gtt.  Plan for cardiac catheterization 3/6.  Troponins continue to uptrend.  Latest 1571.   Assessment & Plan:   Principal Problem:   NSTEMI (non-ST elevated myocardial infarction) ([place]) Active Problems:   Troponin I above reference range   Abnormal CXR  NSTEMI Patient for left heart catheterization 3/6 Troponins continue to uptrend No chest pain noted on time of my evaluation Plan: N.p.o. for left heart cath today Continue heparin GTT Telemetry monitor  Hypothyroid PTA Synthroid  DVT prophylaxis: Heparin GTT Code Status: Full Family Communication: Spouse at bedside 3/6 Disposition Plan: Status is: Inpatient Remains inpatient appropriate because: NSTEMI on heparin gtt.  Left heart catheterization today.  Possible discharge in 24 hours.   Level of care: Telemetry Cardiac  Consultants:  Cardiology  Procedures:  Left heart catheterization 3/6  Antimicrobials: None   Subjective: Seen and  examined.  Resting comfortably in bed.  Spouse at bedside.  No chest pain  Objective: Vitals:   08/08/22 2300 08/09/22 0130 08/09/22 0823 08/09/22 1212  BP:  131/73 124/73 139/74  Pulse:  66 (!) 59 60  Resp: '14 20 16 15  '$ Temp:  98.6 F (37 C) 98.1 F (36.7 C) 98 F (36.7 C)  TempSrc:    Oral  SpO2:  96% 93% 94%  Weight:      Height:        Intake/Output Summary (Last 24 hours) at 08/09/2022 1347 Last data filed at 08/09/2022 0900 Gross per 24 hour  Intake 635.55 ml  Output 450 ml  Net 185.55 ml   Filed Weights   08/08/22 1654 08/08/22 2117  Weight: 56.7 kg 60.9 kg    Examination:  General exam: Appears calm and comfortable  Respiratory system: Clear to auscultation. Respiratory effort normal. Cardiovascular system: S1-S2, RRR, no murmurs, no pedal edema Gastrointestinal system: Soft, NT/ND, normal bowel sounds Central nervous system: Alert and oriented. No focal neurological deficits. Extremities: Symmetric 5 x 5 power. Skin: No rashes, lesions or ulcers Psychiatry: Judgement and insight appear normal. Mood & affect appropriate.     Data Reviewed: I have personally reviewed following labs and imaging studies  CBC: Recent Labs  Lab 08/08/22 1703 08/09/22 0205  WBC 7.0 7.8  NEUTROABS 4.8  --   HGB 14.8 12.9*  HCT 45.4 39.3  MCV 98.9 97.5  PLT 269 [id number]   Basic Metabolic Panel: Recent Labs  Lab 08/08/22 1703 08/09/22 0205  NA 140 140  K 3.8 3.7  CL 108 110  CO2 25 22  GLUCOSE 101* 104*  BUN 27* 26*  CREATININE 0.96 0.81  CALCIUM 8.8* 8.3*   GFR: Estimated Creatinine Clearance: 76.2 mL/min (by C-G formula based on SCr of 0.81 mg/dL). Liver Function Tests: Recent Labs  Lab 08/08/22 1703  AST 24  ALT 20  ALKPHOS 65  BILITOT 0.5  PROT 7.2  ALBUMIN 4.1   No results for input(s): "LIPASE", "AMYLASE" in the last 168 hours. No results for input(s): "AMMONIA" in the last 168 hours. Coagulation Profile: Recent Labs  Lab 08/08/22 1703  INR 1.1    Cardiac Enzymes: No results for input(s): "CKTOTAL", "CKMB", "CKMBINDEX", "TROPONINI" in the last 168 hours. BNP (last 3 results) No results for input(s): "PROBNP" in the last 8760 hours. HbA1C: No results for input(s): "HGBA1C" in the last 72 hours. CBG: No results for input(s): "GLUCAP" in the last 168 hours. Lipid Profile: Recent Labs    08/09/22 0205  CHOL 167  HDL 58  LDLCALC 83  TRIG 130  CHOLHDL 2.9   Thyroid Function Tests: No results for input(s): "TSH", "T4TOTAL", "FREET4", "T3FREE", "THYROIDAB" in the last 72 hours. Anemia Panel: No results for input(s): "VITAMINB12", "FOLATE", "FERRITIN", "TIBC", "IRON", "RETICCTPCT" in the last 72 hours. Sepsis Labs: No results for input(s): "PROCALCITON", "LATICACIDVEN" in the last 168 hours.  No results found for this or any previous visit (from the past 240 hour(s)).       Radiology Studies: ECHOCARDIOGRAM COMPLETE  Result Date: 08/09/2022    ECHOCARDIOGRAM REPORT   Patient Name:   Matthew Peters Date of Exam: 08/09/2022 Medical Rec #:  [id number]     Height:       67.0 in Accession #:    [id number]    Weight:       134.3 lb Date of Birth:  1955-11-08     BSA:          1.707 m Patient Age:    61 years      BP:           131/73 mmHg Patient Gender: M             HR:           66 bpm. Exam Location:  [facility] Procedure: 2D Echo, Color Doppler and Cardiac Doppler Indications:     NSTEMI I21.4  History:         Patient has no prior history of Echocardiogram examinations. No                  cardiac history listed in chart.  Matthew:     Matthew Peters Referring Phys:  Matthew Peters [facility] Matthew Peters Diagnosing Phys: Matthew Sable MD  Matthew Peters: Image quality was good. IMPRESSIONS  1. Left ventricular ejection fraction, by estimation, is 55 to 60%. The left ventricle has normal function. The left ventricle has no regional wall motion abnormalities. There is mild left ventricular hypertrophy. Left ventricular diastolic parameters were normal.   2. Right ventricular systolic function is normal. The right ventricular size is normal.  3. The mitral valve is normal in structure. Mild mitral valve regurgitation.  4. The aortic valve was not well visualized. Aortic valve regurgitation is not visualized. Aortic valve sclerosis/calcification is present, without any evidence of aortic stenosis.  5. The inferior vena cava is normal in size with greater than 50% respiratory variability, suggesting right atrial pressure of 3 mmHg. FINDINGS  Left Ventricle: Left ventricular ejection fraction, by estimation, is 55  to 60%. The left ventricle has normal function. The left ventricle has no regional wall motion abnormalities. The left ventricular internal cavity size was normal in size. There is  mild left ventricular hypertrophy. Left ventricular diastolic parameters were normal. Right Ventricle: The right ventricular size is normal. No increase in right ventricular wall thickness. Right ventricular systolic function is normal. Left Atrium: Left atrial size was normal in size. Right Atrium: Right atrial size was normal in size. Pericardium: There is no evidence of pericardial effusion. Mitral Valve: The mitral valve is normal in structure. Mild mitral valve regurgitation. Tricuspid Valve: The tricuspid valve is normal in structure. Tricuspid valve regurgitation is mild. Aortic Valve: The aortic valve was not well visualized. Aortic valve regurgitation is not visualized. Aortic valve sclerosis/calcification is present, without any evidence of aortic stenosis. Aortic valve mean gradient measures 4.0 mmHg. Aortic valve peak gradient measures 7.4 mmHg. Aortic valve area, by VTI measures 2.23 cm. Pulmonic Valve: The pulmonic valve was not well visualized. Pulmonic valve regurgitation is not visualized. Aorta: The aortic root is normal in size and structure. Venous: The inferior vena cava is normal in size with greater than 50% respiratory variability, suggesting right atrial  pressure of 3 mmHg. IAS/Shunts: No atrial level shunt detected by color flow Doppler.  LEFT VENTRICLE PLAX 2D LVIDd:         4.40 cm   Diastology LVIDs:         2.80 cm   LV e' medial:    7.40 cm/s LV PW:         1.10 cm   LV E/e' medial:  12.5 LV IVS:        1.30 cm   LV e' lateral:   8.70 cm/s LVOT diam:     2.00 cm   LV E/e' lateral: 10.7 LV SV:         60 LV SV Index:   35 LVOT Area:     3.14 cm  RIGHT VENTRICLE RV S prime:     11.50 cm/s TAPSE (M-mode): 2.1 cm LEFT ATRIUM             Index        RIGHT ATRIUM           Index LA diam:        3.40 cm 1.99 cm/m   RA Area:     14.70 cm LA Vol (A2C):   47.3 ml 27.71 ml/m  RA Volume:   36.80 ml  21.56 ml/m LA Vol (A4C):   43.9 ml 25.72 ml/m LA Biplane Vol: 47.1 ml 27.59 ml/m  AORTIC VALVE AV Area (Vmax):    2.14 cm AV Area (Vmean):   1.91 cm AV Area (VTI):     2.23 cm AV Vmax:           136.00 cm/s AV Vmean:          93.300 cm/s AV VTI:            0.271 m AV Peak Grad:      7.4 mmHg AV Mean Grad:      4.0 mmHg LVOT Vmax:         92.60 cm/s LVOT Vmean:        56.600 cm/s LVOT VTI:          0.192 m LVOT/AV VTI ratio: 0.71  AORTA Ao Root diam: 3.10 cm MITRAL VALVE               TRICUSPID  VALVE MV Area (PHT): 3.83 cm    TR Peak grad:   26.6 mmHg MV Decel Time: 198 msec    TR Vmax:        258.00 cm/s MV E velocity: 92.80 cm/s MV A velocity: 99.60 cm/s  SHUNTS MV E/A ratio:  0.93        Systemic VTI:  0.19 m                            Systemic Diam: 2.00 cm Matthew Sable MD Electronically signed by Matthew Sable MD Signature Date/Time: 08/09/2022/11:56:05 AM    Final    US Venous Img Lower Bilateral (DVT)  Result Date: 08/08/2022 CLINICAL DATA:  Bilateral lower extremity swelling. EXAM: Bilateral LOWER EXTREMITY VENOUS DOPPLER ULTRASOUND TECHNIQUE: Gray-scale sonography with compression, as well as color and duplex ultrasound, were performed to evaluate the deep venous system(s) from the level of the common femoral vein through the popliteal and  proximal calf veins. COMPARISON:  None Available. FINDINGS: VENOUS Normal compressibility of the common femoral, superficial femoral, and popliteal veins, as well as the visualized calf veins. Visualized portions of profunda femoral vein and great saphenous vein unremarkable. No filling defects to suggest DVT on grayscale or color Doppler imaging. Doppler waveforms show normal direction of venous flow, normal respiratory plasticity and response to augmentation. Limited views of the contralateral common femoral vein are unremarkable. OTHER None. Limitations: none IMPRESSION: Negative. Electronically Signed   By: Anner Crete M.D.   On: 08/08/2022 21:21   DG Chest Portable 1 View  Result Date: 08/08/2022 CLINICAL DATA:  Chest pain EXAM: PORTABLE CHEST 1 VIEW COMPARISON:  April 09, 2006 FINDINGS: Mild opacity in the lateral right base is linear consistent with scar atelectasis. Mild opacity in the left base also identified with lateral linear old components. The heart, hila, mediastinum, lungs, and pleura are otherwise unremarkable. IMPRESSION: Opacity in left base could represent atelectasis or infiltrate/pneumonia. Recommend short-term follow-up to ensure resolution. Scarring in the bases laterally. Electronically Signed   By: Dorise Bullion III M.D.   On: 08/08/2022 17:37        Scheduled Meds:  [MAR Hold] atorvastatin  40 mg Oral Daily   [MAR Hold] levothyroxine  75 mcg Oral Q0600   [MAR Hold] metoprolol succinate  25 mg Oral Daily   [MAR Hold] sodium chloride flush  3 mL Intravenous Q12H   [MAR Hold] sodium chloride flush  3 mL Intravenous Q12H   Continuous Infusions:  [MAR Hold] sodium chloride     sodium chloride 75 mL/hr at 08/09/22 0700   heparin 1,000 Units/hr (08/09/22 1124)     LOS: 1 day       Sidney Ace, MD [facility]   If 7PM-7AM, please contact night-coverage  08/09/2022, 1:47 PM

## 2022-08-09 NOTE — Progress Notes (Signed)
ANTICOAGULATION CONSULT NOTE   Pharmacy Consult for Heparin Infusion  Indication: chest pain/ACS  Allergies  Allergen Reactions   Sulfa Antibiotics Anaphylaxis    Patient Measurements: Height: '5\' 7"'$  (170.2 cm) Weight: 60.9 kg (134 lb 4.2 oz) IBW/kg (Calculated) : 66.1  Vital Signs: Temp: 98.1 F (36.7 C) (03/06 0823) BP: 124/73 (03/06 0823) Pulse Rate: 59 (03/06 0823)  Labs: Recent Labs    08/08/22 1703 08/08/22 1919 08/08/22 2310 08/09/22 0205 08/09/22 0521 08/09/22 0823  HGB 14.8  --   --  12.9*  --   --   HCT 45.4  --   --  39.3  --   --   PLT 269  --   --  237  --   --   APTT 28  --   --   --   --   --   LABPROT 13.6  --   --   --   --   --   INR 1.1  --   --   --   --   --   HEPARINUNFRC  --   --  <0.10*  --   --  0.15*  CREATININE 0.96  --   --  0.81  --   --   TROPONINIHS 356*   < > 964* 1,130* 1,417* 1,532*   < > = values in this interval not displayed.     Estimated Creatinine Clearance: 76.2 mL/min (by C-G formula based on SCr of 0.81 mg/dL).   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 Pharmacy has been consulted for heparin infusion dosing and monitoring for NSTEMI/ACS.  Goal of Therapy:  Heparin level 0.3-0.7 units/ml Monitor platelets by anticoagulation protocol: Yes   Results: [id number]  HL< 0.1, subtherapeutic 0306 0823 HL 0.15, subtherpeutic, heparin rate 850 un/hr  Plan:  Bolus heparin at 1900 units x 1 dose increase heparin infusion rate to to 1000 units/hr Recheck HL in 6 hr after rate change CBC daily while on heparin  Philipp Callegari Rodriguez-Guzman PharmD, BCPS 08/09/2022 10:36 AM

## 2022-08-09 NOTE — Progress Notes (Signed)
Mobility Specialist - Progress Note   08/09/22 0855  Mobility  Activity Ambulated independently in hallway;Stood at bedside;Dangled on edge of bed  Level of Assistance Independent  Assistive Device None  Distance Ambulated (ft) 180 ft  Activity Response Tolerated well  Mobility Referral Yes  $Mobility charge 1 Mobility   Pt supine in bed on RA upon arrival. Pt STS and ambulates in hallway indep. Pt returns to bed with needs in reach.   Gretchen Short  Mobility Specialist  08/09/22 8:56 AM

## 2022-08-09 NOTE — Plan of Care (Signed)

## 2022-08-09 NOTE — Progress Notes (Addendum)
ANTICOAGULATION CONSULT NOTE   Pharmacy Consult for Heparin Infusion  Indication: chest pain/ACS  Allergies  Allergen Reactions   Sulfa Antibiotics Anaphylaxis    Patient Measurements: Height: '5\' 7"'$  (170.2 cm) Weight: 60.9 kg (134 lb 4.2 oz) IBW/kg (Calculated) : 66.1  Vital Signs: Temp: 98.6 F (37 C) (03/06 0130) Temp Source: Oral (03/05 2117) BP: 131/73 (03/06 0130) Pulse Rate: 66 (03/06 0130)  Labs: Recent Labs    08/08/22 1703 08/08/22 1919 08/08/22 2310  HGB 14.8  --   --   HCT 45.4  --   --   PLT 269  --   --   APTT 28  --   --   LABPROT 13.6  --   --   INR 1.1  --   --   HEPARINUNFRC  --   --  <0.10*  CREATININE 0.96  --   --   TROPONINIHS 356* 508* 964*     Estimated Creatinine Clearance: 64.3 mL/min (by C-G formula based on SCr of 0.96 mg/dL).   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 Pharmacy has been consulted for heparin infusion dosing and monitoring for NSTEMI/ACS.  3/5 @ 1703 Troponin I: 356  Goal of Therapy:  Heparin level 0.3-0.7 units/ml Monitor platelets by anticoagulation protocol: Yes   0305 2310 HL< 0.1, subtherapeutic  Plan:  Bolus 1800 units x 1 Increase heparin infusion to 850 units/hr Recheck HL in 6 hr after rate change CBC daily while on heparin  Renda Rolls, PharmD, MBA 08/09/2022 2:00 AM

## 2022-08-10 ENCOUNTER — Encounter: Payer: Self-pay | Admitting: Internal Medicine

## 2022-08-10 DIAGNOSIS — F17200 Nicotine dependence, unspecified, uncomplicated: Secondary | ICD-10-CM

## 2022-08-10 DIAGNOSIS — E782 Mixed hyperlipidemia: Secondary | ICD-10-CM

## 2022-08-10 DIAGNOSIS — E785 Hyperlipidemia, unspecified: Secondary | ICD-10-CM

## 2022-08-10 LAB — CBC
HCT: 38.4 % — ABNORMAL LOW (ref 39.0–52.0)
Hemoglobin: 12.9 g/dL — ABNORMAL LOW (ref 13.0–17.0)
MCH: 32.7 pg (ref 26.0–34.0)
MCHC: 33.6 g/dL (ref 30.0–36.0)
MCV: 97.5 fL (ref 80.0–100.0)
Platelets: 221 10*3/uL (ref 150–400)
RBC: 3.94 MIL/uL — ABNORMAL LOW (ref 4.22–5.81)
RDW: 15.4 % (ref 11.5–15.5)
WBC: 7.4 10*3/uL (ref 4.0–10.5)
nRBC: 0 % (ref 0.0–0.2)

## 2022-08-10 LAB — THYROID PANEL WITH TSH
Free Thyroxine Index: 1.7 (ref 1.2–4.9)
T3 Uptake Ratio: 27 % (ref 24–39)
T4, Total: 6.4 ug/dL (ref 4.5–12.0)
TSH: 1.9 u[IU]/mL (ref 0.450–4.500)

## 2022-08-10 LAB — BASIC METABOLIC PANEL
Anion gap: 7 (ref 5–15)
BUN: 20 mg/dL (ref 8–23)
CO2: 22 mmol/L (ref 22–32)
Calcium: 8.4 mg/dL — ABNORMAL LOW (ref 8.9–10.3)
Chloride: 109 mmol/L (ref 98–111)
Creatinine, Ser: 0.77 mg/dL (ref 0.61–1.24)
GFR, Estimated: >60 mL/min (ref >60)
Glucose, Bld: 94 mg/dL (ref 70–99)
Potassium: 3.8 mmol/L (ref 3.5–5.1)
Sodium: 138 mmol/L (ref 135–145)

## 2022-08-10 LAB — POCT ACTIVATED CLOTTING TIME
Activated Clotting Time: 266 seconds
Activated Clotting Time: 298 seconds
Activated Clotting Time: 320 seconds

## 2022-08-10 LAB — HEMOGLOBIN A1C
Hgb A1c MFr Bld: 5.6 % (ref 4.8–5.6)
Mean Plasma Glucose: 114 mg/dL

## 2022-08-10 MED ORDER — ASPIRIN 81 MG PO TBEC: 81.0000 mg | DELAYED_RELEASE_TABLET | Freq: Every day | ORAL | 12 refills | Status: AC

## 2022-08-10 MED ORDER — ATORVASTATIN CALCIUM 40 MG PO TABS: 40.0000 mg | ORAL_TABLET | Freq: Every day | ORAL | 0 refills | Status: DC

## 2022-08-10 MED ORDER — CLOPIDOGREL BISULFATE 75 MG PO TABS: 75.0000 mg | ORAL_TABLET | Freq: Every day | ORAL | 0 refills | Status: DC

## 2022-08-10 NOTE — Progress Notes (Signed)
   Patient Name: Matthew Peters Date of Encounter: 08/10/2022 [place] Cardiologist: None  New consult done by Dr End Interval Summary   Patient seen on AM rounds. Denies any chest pain or shortness of breath. Successful PCI/DES to the proximal/mid Lcx 08/09/22. Toprol XL has been held to to consistent bradycardia noted on telemetry monitoring.  Vital Signs   Vitals:   08/09/22 1942 08/09/22 2320 08/10/22 0406 08/10/22 0806  BP: 107/71 109/66 (!) 102/58 (!) 126/58  Pulse: (!) 58 (!) 51 (!) 58 (!) 55  Resp: '18 16 18 16  '$ Temp: 98.1 F (36.7 C) 98.5 F (36.9 C) 98.3 F (36.8 C) 97.6 F (36.4 C)  TempSrc: Oral   Oral  SpO2: 97% 97% 98% 98%  Weight:      Height:        Intake/Output Summary (Last 24 hours) at 08/10/2022 1032 Last data filed at 08/10/2022 0940 Gross per 24 hour  Intake 893.95 ml  Output 1125 ml  Net -231.05 ml      08/08/2022    9:17 PM 08/08/2022    4:54 PM 05/20/2022   11:02 PM  Last 3 Weights  Weight (lbs) 134 lb 4.2 oz 125 lb 125 lb  Weight (kg) 60.9 kg 56.7 kg 56.7 kg      Telemetry/ECG    Sinus bradycardia rate in the 50's - Personally Reviewed  Physical Exam  GEN: No acute distress.   Neck: No JVD Cardiac: RRR, bradycardic, no murmurs, rubs, or gallops. 2+ radial pulse without bleeding or hematoma, opsite and gauze dressing clean,dry, and intact Respiratory: diminished to auscultation bilaterally.Respirations are unlabored at rest on room air GI: Soft, nontender, non-distended, gastrostomy tube is present MS: No edema  Assessment & Plan    NSTEMI s/p PCI/DES proximal/mid Lcx -remains pain free -Hs troponins peaked 2331 -continued on ASA, statin, clopidogrel -no beta blocker due to underlying bradycardia -echocardiogram with normal EF 55-60% -continue on DAPT for at least 12 months -if he has recurrent chest pain can consider functional study to assess hemodynamic significance -refer to cardiac rehab -aggressive secondary  prevention  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

## 2022-08-10 NOTE — Plan of Care (Signed)
Patient AOX4, VSS throughout shift.  All meds given on time as ordered.  Diminished lungs, IS encouraged.  G-tube clamped in place.  Denied pain and SOB.  Purewick in place.  POC maintained, will continue to monitor.  Problem: Education: Goal: Understanding of CV disease, CV risk reduction, and recovery process will improve Outcome: Progressing Goal: Individualized Educational Video(s) Outcome: Progressing   Problem: Activity: Goal: Ability to return to baseline activity level will improve Outcome: Progressing   Problem: Cardiovascular: Goal: Ability to achieve and maintain adequate cardiovascular perfusion will improve Outcome: Progressing Goal: Vascular access site(s) Level 0-1 will be maintained Outcome: Progressing   Problem: Health Behavior/Discharge Planning: Goal: Ability to safely manage health-related needs after discharge will improve Outcome: Progressing   Problem: Education: Goal: Knowledge of General Education information will improve Description: Including pain rating scale, medication(s)/side effects and non-pharmacologic comfort measures Outcome: Progressing   Problem: Health Behavior/Discharge Planning: Goal: Ability to manage health-related needs will improve Outcome: Progressing   Problem: Clinical Measurements: Goal: Ability to maintain clinical measurements within normal limits will improve Outcome: Progressing Goal: Will remain free from infection Outcome: Progressing Goal: Diagnostic test results will improve Outcome: Progressing Goal: Respiratory complications will improve Outcome: Progressing Goal: Cardiovascular complication will be avoided Outcome: Progressing   Problem: Activity: Goal: Risk for activity intolerance will decrease Outcome: Progressing   Problem: Nutrition: Goal: Adequate nutrition will be maintained Outcome: Progressing   Problem: Coping: Goal: Level of anxiety will decrease Outcome: Progressing   Problem:  Elimination: Goal: Will not experience complications related to bowel motility Outcome: Progressing Goal: Will not experience complications related to urinary retention Outcome: Progressing   Problem: Pain Managment: Goal: General experience of comfort will improve Outcome: Progressing   Problem: Safety: Goal: Ability to remain free from injury will improve Outcome: Progressing   Problem: Skin Integrity: Goal: Risk for impaired skin integrity will decrease Outcome: Progressing

## 2022-08-10 NOTE — Discharge Summary (Signed)
Physician Discharge Summary  Matthew Peters [id number] DOB: 25-Jun-1955 DOA: 08/08/2022  PCP: Cletis Athens, MD  Admit date: 08/08/2022 Discharge date: 08/10/2022  Admitted From: Home Disposition:  Home  Recommendations for Outpatient Follow-up:  Follow up with PCP in 1-2 weeks Follow-up with [facility] cardiology 1 week  Home Health: No Equipment/Devices: G-tube  Discharge Condition: Stable CODE STATUS: Full Diet recommendation: Heart healthy  Brief/Interim Summary:  67 y.o. male was in his usual state of health till about 48 hours ago.  Over the last 2 days patient reports on and off right anterior chest area pain/discomfort that he further does not describe except as indigestion like without any precipitating or relieving factors without any aggravating factors it was radiating towards the right arm.  There has been no change in the location of the pain.  Not associated with presyncope or palpitation or sensation of shortness of breath no leg swelling or cramping no fever or cough.  Patient reports that the pain was initially mild to moderate lasting a minute or 2 to 10 to 15 minutes at a time occurring several times a day.  However today there were several episodes that were pretty severe prompting the patient to come to the hospital.    Patient was admitted under presumptive diagnosis of NSTEMI.  Started on heparin gtt.  Plan for cardiac catheterization 3/6.  Troponins continue to uptrend.  Underwent left heart catheterization 3/6.  95% proximal left circumflex disease.  1 stent deployed.  Patient tolerated procedure well.  Seen and evaluated the morning after catheterization.  Stable, no distress.  Chest pain-free.  Seen by cardiology.  Cleared for discharge home at this time.  Will follow-up with PCP and cardiology within 1 week of discharge.  Discharged on aspirin, Plavix, statin.  Metoprolol held due to bradycardia.  Can be reevaluated as outpatient.    Discharge Diagnoses:  Principal  Problem:   NSTEMI (non-ST elevated myocardial infarction) ([place]) Active Problems:   Troponin I above reference range   Abnormal CXR  NSTEMI Status post left heart catheterization on 3/6.  95% stenosis of left circumflex.  Status post PCI with DES x 1 deployed.  Tolerated procedure well.  No immediate postoperative complications.  Seen by cardiology on the day of discharge.  Cleared for DC home.  DC home on aspirin, Plavix, statin.  Metoprolol held due to bradycardia.  Follow-up outpatient cardiology Dr. Saunders Revel in 1 week.  Hypothyroid PTA Synthroid  Discharge Instructions  Discharge Instructions     AMB Referral to Cardiac Rehabilitation - Phase II   Complete by: As directed    Diagnosis:  Coronary Stents NSTEMI     After initial evaluation and assessments completed: Virtual Based Care may be provided alone or in conjunction with Phase 2 Cardiac Rehab based on patient barriers.: Yes   Intensive Cardiac Rehabilitation (ICR) [place] location only OR Traditional Cardiac Rehabilitation (TCR) *If criteria for ICR are not met will enroll in TCR [facility] only): Yes   Ambulatory Referral for Lung Cancer Scre   Complete by: As directed    Diet - low sodium heart healthy   Complete by: As directed    Increase activity slowly   Complete by: As directed       Allergies as of 08/10/2022       Reactions   Sulfa Antibiotics Anaphylaxis        Medication List     STOP taking these medications    mirtazapine 30 MG tablet Commonly known as: REMERON  oxyCODONE 5 MG immediate release tablet Commonly known as: Oxy IR/ROXICODONE   pentoxifylline 400 MG CR tablet Commonly known as: TRENTAL   predniSONE 10 MG (21) Tbpk tablet Commonly known as: STERAPRED UNI-PAK 21 TAB       TAKE these medications    acetaminophen 325 MG tablet Commonly known as: TYLENOL Take 650 mg by mouth every 4 (four) hours as needed.   aspirin EC 81 MG tablet Take 1 tablet (81 mg total) by mouth daily. Swallow  whole. Start taking on: August 11, 2022   atorvastatin 40 MG tablet Commonly known as: LIPITOR Take 1 tablet (40 mg total) by mouth daily. Start taking on: August 11, 2022   clopidogrel 75 MG tablet Commonly known as: PLAVIX Take 1 tablet (75 mg total) by mouth daily with breakfast. Start taking on: August 11, 2022   levothyroxine 75 MCG tablet Commonly known as: SYNTHROID Take 1 tablet by mouth daily.   Vitamin E/D-Alpha Natural 268 MG (400 UNIT) Caps Generic drug: Vitamin E Take 1 tablet by mouth daily.        Follow-up Information     Cletis Athens, MD. Schedule an appointment as soon as possible for a visit in 1 week(s).   Specialties: Internal Medicine, Cardiology Contact information: [place] [address] [phone]         Nelva Bush, MD. Schedule an appointment as soon as possible for a visit in 1 week(s).   Specialty: Cardiology Contact information: [place] [address] [phone]                Allergies  Allergen Reactions   Sulfa Antibiotics Anaphylaxis    Consultations: Cardiology-[facility]   Procedures/Studies: CARDIAC CATHETERIZATION  Result Date: 08/09/2022 Conclusions: Multivessel coronary artery disease, as detailed below, including 50-60% mid and 60-70% distal LAD disease, thrombotic 95% proximal LCx lesion followed by 50% mid vessel stenosis, and chronic total occlusion of mid RCA with bridging and left-to-right collaterals. Normal left ventricular filling pressure (LVEDP 10 mmHg). Successful PCI to proximal/mid LCx using Onyx Frontier 2.75 x 26 mm drug-eluting stent, complicated by distal edge dissection successfully treated with overlapping Onyx Frontier 2.5 x 8 mm drug-eluting stent; final angiogram shows 0% residual stenosis with TIMI-3 flow. Recommendations: Continue dual antiplatelet therapy with aspirin and clopidogrel for at least 12 months. Aggressive secondary prevention of coronary  artery disease. Continue medical therapy for LAD and RCA disease.  Consider functional study if the patient has recurrent chest pain to assess hemodynamic significance of mid LAD stenosis. Nelva Bush, MD [facility]  ECHOCARDIOGRAM COMPLETE  Result Date: 08/09/2022    ECHOCARDIOGRAM REPORT   Patient Name:   Matthew Peters Date of Exam: 08/09/2022 Medical Rec #:  [id number]     Height:       67.0 in Accession #:    [id number]    Weight:       134.3 lb Date of Birth:  1955/09/20     BSA:          1.707 m Patient Age:    52 years      BP:           131/73 mmHg Patient Gender: M             HR:           66 bpm. Exam Location:  [facility] Procedure: 2D Echo, Color Doppler and Cardiac Doppler Indications:     NSTEMI I21.4  History:  Patient has no prior history of Echocardiogram examinations. No                  cardiac history listed in chart.  Sonographer:     Sherrie Sport Referring Phys:  [id number] [facility] GOEL Diagnosing Phys: Kate Sable MD  Sonographer Comments: Image quality was good. IMPRESSIONS  1. Left ventricular ejection fraction, by estimation, is 55 to 60%. The left ventricle has normal function. The left ventricle has no regional wall motion abnormalities. There is mild left ventricular hypertrophy. Left ventricular diastolic parameters were normal.  2. Right ventricular systolic function is normal. The right ventricular size is normal.  3. The mitral valve is normal in structure. Mild mitral valve regurgitation.  4. The aortic valve was not well visualized. Aortic valve regurgitation is not visualized. Aortic valve sclerosis/calcification is present, without any evidence of aortic stenosis.  5. The inferior vena cava is normal in size with greater than 50% respiratory variability, suggesting right atrial pressure of 3 mmHg. FINDINGS  Left Ventricle: Left ventricular ejection fraction, by estimation, is 55 to 60%. The left ventricle has normal function. The left ventricle has no regional wall  motion abnormalities. The left ventricular internal cavity size was normal in size. There is  mild left ventricular hypertrophy. Left ventricular diastolic parameters were normal. Right Ventricle: The right ventricular size is normal. No increase in right ventricular wall thickness. Right ventricular systolic function is normal. Left Atrium: Left atrial size was normal in size. Right Atrium: Right atrial size was normal in size. Pericardium: There is no evidence of pericardial effusion. Mitral Valve: The mitral valve is normal in structure. Mild mitral valve regurgitation. Tricuspid Valve: The tricuspid valve is normal in structure. Tricuspid valve regurgitation is mild. Aortic Valve: The aortic valve was not well visualized. Aortic valve regurgitation is not visualized. Aortic valve sclerosis/calcification is present, without any evidence of aortic stenosis. Aortic valve mean gradient measures 4.0 mmHg. Aortic valve peak gradient measures 7.4 mmHg. Aortic valve area, by VTI measures 2.23 cm. Pulmonic Valve: The pulmonic valve was not well visualized. Pulmonic valve regurgitation is not visualized. Aorta: The aortic root is normal in size and structure. Venous: The inferior vena cava is normal in size with greater than 50% respiratory variability, suggesting right atrial pressure of 3 mmHg. IAS/Shunts: No atrial level shunt detected by color flow Doppler.  LEFT VENTRICLE PLAX 2D LVIDd:         4.40 cm   Diastology LVIDs:         2.80 cm   LV e' medial:    7.40 cm/s LV PW:         1.10 cm   LV E/e' medial:  12.5 LV IVS:        1.30 cm   LV e' lateral:   8.70 cm/s LVOT diam:     2.00 cm   LV E/e' lateral: 10.7 LV SV:         60 LV SV Index:   35 LVOT Area:     3.14 cm  RIGHT VENTRICLE RV S prime:     11.50 cm/s TAPSE (M-mode): 2.1 cm LEFT ATRIUM             Index        RIGHT ATRIUM           Index LA diam:        3.40 cm 1.99 cm/m   RA Area:     14.70 cm LA Vol (A2C):  47.3 ml 27.71 ml/m  RA Volume:   36.80 ml   21.56 ml/m LA Vol (A4C):   43.9 ml 25.72 ml/m LA Biplane Vol: 47.1 ml 27.59 ml/m  AORTIC VALVE AV Area (Vmax):    2.14 cm AV Area (Vmean):   1.91 cm AV Area (VTI):     2.23 cm AV Vmax:           136.00 cm/s AV Vmean:          93.300 cm/s AV VTI:            0.271 m AV Peak Grad:      7.4 mmHg AV Mean Grad:      4.0 mmHg LVOT Vmax:         92.60 cm/s LVOT Vmean:        56.600 cm/s LVOT VTI:          0.192 m LVOT/AV VTI ratio: 0.71  AORTA Ao Root diam: 3.10 cm MITRAL VALVE               TRICUSPID VALVE MV Area (PHT): 3.83 cm    TR Peak grad:   26.6 mmHg MV Decel Time: 198 msec    TR Vmax:        258.00 cm/s MV E velocity: 92.80 cm/s MV A velocity: 99.60 cm/s  SHUNTS MV E/A ratio:  0.93        Systemic VTI:  0.19 m                            Systemic Diam: 2.00 cm Kate Sable MD Electronically signed by Kate Sable MD Signature Date/Time: 08/09/2022/11:56:05 AM    Final    US Venous Img Lower Bilateral (DVT)  Result Date: 08/08/2022 CLINICAL DATA:  Bilateral lower extremity swelling. EXAM: Bilateral LOWER EXTREMITY VENOUS DOPPLER ULTRASOUND TECHNIQUE: Gray-scale sonography with compression, as well as color and duplex ultrasound, were performed to evaluate the deep venous system(s) from the level of the common femoral vein through the popliteal and proximal calf veins. COMPARISON:  None Available. FINDINGS: VENOUS Normal compressibility of the common femoral, superficial femoral, and popliteal veins, as well as the visualized calf veins. Visualized portions of profunda femoral vein and great saphenous vein unremarkable. No filling defects to suggest DVT on grayscale or color Doppler imaging. Doppler waveforms show normal direction of venous flow, normal respiratory plasticity and response to augmentation. Limited views of the contralateral common femoral vein are unremarkable. OTHER None. Limitations: none IMPRESSION: Negative. Electronically Signed   By: Anner Crete M.D.   On: 08/08/2022 21:21    DG Chest Portable 1 View  Result Date: 08/08/2022 CLINICAL DATA:  Chest pain EXAM: PORTABLE CHEST 1 VIEW COMPARISON:  April 09, 2006 FINDINGS: Mild opacity in the lateral right base is linear consistent with scar atelectasis. Mild opacity in the left base also identified with lateral linear old components. The heart, hila, mediastinum, lungs, and pleura are otherwise unremarkable. IMPRESSION: Opacity in left base could represent atelectasis or infiltrate/pneumonia. Recommend short-term follow-up to ensure resolution. Scarring in the bases laterally. Electronically Signed   By: Dorise Bullion III M.D.   On: 08/08/2022 17:37      Subjective: Seen and examined on the day of discharge.  Stable no distress.  Chest pain-free.  Stable for discharge home.  Discharge Exam: Vitals:   08/10/22 0406 08/10/22 0806  BP: (!) 102/58 (!) 126/58  Pulse: (!) 58 (!) 55  Resp:  18 16  Temp: 98.3 F (36.8 C) 97.6 F (36.4 C)  SpO2: 98% 98%   Vitals:   08/09/22 1942 08/09/22 2320 08/10/22 0406 08/10/22 0806  BP: 107/71 109/66 (!) 102/58 (!) 126/58  Pulse: (!) 58 (!) 51 (!) 58 (!) 55  Resp: '18 16 18 16  '$ Temp: 98.1 F (36.7 C) 98.5 F (36.9 C) 98.3 F (36.8 C) 97.6 F (36.4 C)  TempSrc: Oral   Oral  SpO2: 97% 97% 98% 98%  Weight:      Height:        General: Pt is alert, awake, not in acute distress Cardiovascular: RRR, S1/S2 +, no rubs, no gallops Respiratory: CTA bilaterally, no wheezing, no rhonchi Abdominal: Soft, NT, ND, bowel sounds + Extremities: no edema, no cyanosis    The results of significant diagnostics from this hospitalization (including imaging, microbiology, ancillary and laboratory) are listed below for reference.     Microbiology: No results found for this or any previous visit (from the past 240 hour(s)).   Labs: BNP (last 3 results) No results for input(s): "BNP" in the last 8760 hours. Basic Metabolic Panel: Recent Labs  Lab 08/08/22 1703 08/09/22 0205  08/10/22 0345  NA 140 140 138  K 3.8 3.7 3.8  CL 108 110 109  CO2 '25 22 22  '$ GLUCOSE 101* 104* 94  BUN 27* 26* 20  CREATININE 0.96 0.81 0.77  CALCIUM 8.8* 8.3* 8.4*   Liver Function Tests: Recent Labs  Lab 08/08/22 1703  AST 24  ALT 20  ALKPHOS 65  BILITOT 0.5  PROT 7.2  ALBUMIN 4.1   No results for input(s): "LIPASE", "AMYLASE" in the last 168 hours. No results for input(s): "AMMONIA" in the last 168 hours. CBC: Recent Labs  Lab 08/08/22 1703 08/09/22 0205 08/10/22 0345  WBC 7.0 7.8 7.4  NEUTROABS 4.8  --   --   HGB 14.8 12.9* 12.9*  HCT 45.4 39.3 38.4*  MCV 98.9 97.5 97.5  PLT 269 237 221   Cardiac Enzymes: No results for input(s): "CKTOTAL", "CKMB", "CKMBINDEX", "TROPONINI" in the last 168 hours. BNP: Invalid input(s): "POCBNP" CBG: No results for input(s): "GLUCAP" in the last 168 hours. D-Dimer Recent Labs    08/08/22 1703  DDIMER <0.27   Hgb A1c Recent Labs    08/09/22 0205  HGBA1C 5.6   Lipid Profile Recent Labs    08/09/22 0205  CHOL 167  HDL 58  LDLCALC 83  TRIG 130  CHOLHDL 2.9   Thyroid function studies Recent Labs    08/08/22 2310  TSH 1.900  T4TOTAL 6.4   Anemia work up No results for input(s): "VITAMINB12", "FOLATE", "FERRITIN", "TIBC", "IRON", "RETICCTPCT" in the last 72 hours. Urinalysis    Component Value Date/Time   COLORURINE Yellow 08/06/2013 0225   APPEARANCEUR Hazy 08/06/2013 0225   LABSPEC 1.032 08/06/2013 0225   PHURINE 5.0 08/06/2013 0225   GLUCOSEU Negative 08/06/2013 0225   HGBUR 2+ 08/06/2013 0225   BILIRUBINUR Negative 08/06/2013 0225   KETONESUR Negative 08/06/2013 0225   PROTEINUR 30 mg/dL 08/06/2013 0225   NITRITE Negative 08/06/2013 0225   LEUKOCYTESUR Negative 08/06/2013 0225   Sepsis Labs Recent Labs  Lab 08/08/22 1703 08/09/22 0205 08/10/22 0345  WBC 7.0 7.8 7.4   Microbiology No results found for this or any previous visit (from the past 240 hour(s)).   Time coordinating discharge:  Over 30 minutes  SIGNED:   Sidney Ace, MD  [facility] 08/10/2022, 10:40 AM Pager  If 7PM-7AM, please contact night-coverage

## 2022-08-12 LAB — LIPOPROTEIN A (LPA): Lipoprotein (a): 27.9 nmol/L (ref <75.0)

## 2022-08-14 ENCOUNTER — Telehealth: Payer: Self-pay | Admitting: Nurse Practitioner

## 2022-08-14 MED ORDER — TICAGRELOR 90 MG PO TABS: 90.0000 mg | ORAL_TABLET | Freq: Two times a day (BID) | ORAL | 11 refills | Status: DC

## 2022-08-14 NOTE — Telephone Encounter (Signed)
The patient's wife has been made aware. She will have the patient take the 180 mg today and then 90 mg bid after.

## 2022-08-14 NOTE — Telephone Encounter (Signed)
Returned the call to the patient and his patient. The patient gave verbal permission to speak to his wife as she is not listed on the DPR. The patient had a cardiac cath on 3/6 with stents and instructions for dual antiplatelet for 12 months.   The patient broke out in a rash on Saturday. He has an allergy to sulfates which is in Plavix. He discontinued the medication on Saturday. This has been added to his allergy list.   The wife stated that the rash is subsiding.

## 2022-08-14 NOTE — Telephone Encounter (Signed)
Pt c/o medication issue:  1. Name of Medication: clopidogrel (PLAVIX) 75 MG tablet   2. How are you currently taking this medication (dosage and times per day)?    3. Are you having a reaction (difficulty breathing--STAT)? no  4. What is your medication issue? Wife states he had allergic reaction to the medication. Patient has stop taking the medication. Please advise

## 2022-08-14 NOTE — Telephone Encounter (Signed)
He should not stop any of his heart medications without consulting with [place] first.  Sulfa allergy itself is not a contraindication to use of clopidogrel.  His rash could have been due to any number of medications that he received during his hospitalization.  However, in order to remove this as a possible reaction, I recommend that he begin ticagrelor immediately (180 mg for the first dose, following by 90 mg BID thereafter).  Nelva Bush, MD [facility]

## 2022-09-01 ENCOUNTER — Ambulatory Visit: Payer: Medicare Other | Attending: Nurse Practitioner | Admitting: Nurse Practitioner

## 2022-09-01 ENCOUNTER — Encounter: Payer: Self-pay | Admitting: Nurse Practitioner

## 2022-09-01 VITALS — BP 146/84 | HR 58 | Ht 67.0 in | Wt 141.0 lb

## 2022-09-01 DIAGNOSIS — I214 Non-ST elevation (NSTEMI) myocardial infarction: Secondary | ICD-10-CM

## 2022-09-01 DIAGNOSIS — I2510 Atherosclerotic heart disease of native coronary artery without angina pectoris: Secondary | ICD-10-CM | POA: Diagnosis not present

## 2022-09-01 DIAGNOSIS — I10 Essential (primary) hypertension: Secondary | ICD-10-CM | POA: Diagnosis not present

## 2022-09-01 DIAGNOSIS — L270 Generalized skin eruption due to drugs and medicaments taken internally: Secondary | ICD-10-CM

## 2022-09-01 DIAGNOSIS — E785 Hyperlipidemia, unspecified: Secondary | ICD-10-CM

## 2022-09-01 MED ORDER — NITROGLYCERIN 0.4 MG SL SUBL: 0.4000 mg | SUBLINGUAL_TABLET | SUBLINGUAL | 1 refills | Status: DC | PRN

## 2022-09-01 MED ORDER — LOSARTAN POTASSIUM 25 MG PO TABS: 25.0000 mg | ORAL_TABLET | Freq: Every day | ORAL | 0 refills | Status: DC

## 2022-09-01 MED ORDER — ATORVASTATIN CALCIUM 40 MG PO TABS: 40.0000 mg | ORAL_TABLET | Freq: Every day | ORAL | 1 refills | Status: DC

## 2022-09-01 NOTE — Patient Instructions (Signed)
Medication Instructions:  START losartan 25 mg by mouth once daily TAKE Nitroglycerin tablets sublingual as directed  *If you need a refill on your cardiac medications before your next appointment, please call your pharmacy*  Lab Work: CMP and lipid panel to be drawn in 1-2 weeks - Please go to the [facility]. You will check in at the front desk to the right as you walk into the atrium. Valet Parking is offered if needed. - No appointment needed. You may go any day between 7 am and 6 pm.  If you have labs (blood work) drawn today and your tests are completely normal, you will receive your results only by: [place] (if you have MyChart) OR A paper copy in the mail If you have any lab test that is abnormal or we need to change your treatment, we will call you to review the results.   Testing/Procedures: No testing ordered  Follow-Up: At [facility], you and your health needs are our priority.  As part of our continuing mission to provide you with exceptional heart care, we have created designated Provider Care Teams.  These Care Teams include your primary Cardiologist (physician) and Advanced Practice Providers (APPs -  Physician Assistants and Nurse Practitioners) who all work together to provide you with the care you need, when you need it.  We recommend signing up for the patient portal called "MyChart".  Sign up information is provided on this After Visit Summary.  MyChart is used to connect with patients for Virtual Visits (Telemedicine).  Patients are able to view lab/test results, encounter notes, upcoming appointments, etc.  Non-urgent messages can be sent to your provider as well.   To learn more about what you can do with MyChart, go to [email or web address].    Your next appointment:   1 month(s)  Provider:   Nelva Bush, MD or Murray Hodgkins, NP

## 2022-09-01 NOTE — Progress Notes (Signed)
Office Visit    Patient Name: Matthew Peters Date of Encounter: 09/01/2022  Primary Care Provider:  Cletis Athens, MD Primary Cardiologist:  Matthew Bush, MD  Chief Complaint    67 year old male with a history of oral cancer status post chemoradiation complicated by dysphagia and failure to thrive with placement of G-tube in January 2023, and hypothyroidism, who presents for follow-up after recent non-STEMI and PCI.  Past Medical History    Past Medical History:  Diagnosis Date   CAD (coronary artery disease)    a. 08/2022 NSTEMI/PCI: LM short/nl, LAD 50m, 65d, D2 90, LCX 95p/m (2.75x26 Onyx Frontier DES), LCX 36m, 43m/d (2.5x8 Onyx Frontier DES), OM3 20, RCA small, 100p/m CTO, RPDA fills via L->R collats from Sept 1/2, RPAV fills via L->R collats from dLCX.   Failure to thrive in adult    s/p G-tube 06/2022 at [facility]   History of echocardiogram    a. 08/2022 Echo: EF 55-60%, no rwma, mild LVH, nl RV fxn, mild MR. Ao sclerosis.   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)    Past Surgical History:  Procedure Laterality Date   CORONARY STENT INTERVENTION N/A 08/09/2022   Procedure: CORONARY STENT INTERVENTION;  Surgeon: Matthew Bush, MD;  Location: [place];  Service: Cardiovascular;  Laterality: N/A;   GASTROSTOMY TUBE PLACEMENT     LEFT HEART CATH AND CORONARY ANGIOGRAPHY N/A 08/09/2022   Procedure: LEFT HEART CATH AND CORONARY ANGIOGRAPHY;  Surgeon: Matthew Bush, MD;  Location: [facility];  Service: Cardiovascular;  Laterality: N/A;    Allergies  Allergies  Allergen Reactions   Other Anaphylaxis   Sulfa Antibiotics Anaphylaxis    History of Present Illness    67 year old male with above past medical history including oral cancer status post chemoradiation complicated by dysphagia and failure to thrive with placement of a G-tube in January 2023, hypothyroidism, and recent diagnosis of coronary  artery disease.  On August 07, 2022, he began to experience intermittent chest discomfort radiating to the right chest and right arm, associate with nausea and diaphoresis.  Presented to the emergency department on March 5 and was noted to have inferolateral ST segment depressions.  Troponin was elevated and eventually peaked at 2331.  Echocardiogram showed normal LV function (55-60%), mild LVH, and aortic sclerosis.  Diagnostic catheterization revealed a chronic total occlusion of the right coronary artery with left-to-right collaterals as well as moderate LAD and diagonal disease, and severe circumflex disease.  The left circumflex was successfully treated with 2 overlapping drug-eluting stents.  Postprocedure, he was noted to have bradycardia and beta-blocker therapy was held.  He was subsequently discharged home on aspirin, Plavix, and statin.  Following discharge, Matthew Peters developed a rash and stopped taking his medication.  He contacted our office on March 11.  He was advised to discontinue Plavix and this was replaced with ticagrelor, 180 mg x 1 followed by 90 mg twice daily.  Since changing to Brilinta, his rash has resolved.  He has been feeling well and denies chest pain, dyspnea, palpitations, PND, orthopnea, dizziness, syncope, edema, or early satiety.  His right wrist catheterization site is healed up well.  He is interested in participating in cardiac rehab.  Home Medications    Current Outpatient Medications  Medication Sig Dispense Refill   acetaminophen (TYLENOL) 325 MG tablet Take 650 mg by mouth every 4 (four) hours as needed.  aspirin EC 81 MG tablet Take 1 tablet (81 mg total) by mouth daily. Swallow whole. 30 tablet 12   levothyroxine (SYNTHROID) 75 MCG tablet Take 1 tablet by mouth daily.     losartan (COZAAR) 25 MG tablet Take 1 tablet (25 mg total) by mouth daily. 90 tablet 0   nitroGLYCERIN (NITROSTAT) 0.4 MG SL tablet Place 1 tablet (0.4 mg total) under the tongue every 5  (five) minutes as needed for chest pain. 25 tablet 1   ticagrelor (BRILINTA) 90 MG TABS tablet Take 1 tablet (90 mg total) by mouth 2 (two) times daily. 60 tablet 11   Vitamin E (VITAMIN E/D-ALPHA NATURAL) 268 MG (400 UNIT) CAPS Take 1 tablet by mouth daily.     atorvastatin (LIPITOR) 40 MG tablet Take 1 tablet (40 mg total) by mouth daily. 30 tablet 1   No current facility-administered medications for this visit.     Review of Systems    Had drug rash which has since resolved.  He denies chest pain, palpitations, dyspnea, pnd, orthopnea, n, v, dizziness, syncope, edema, weight gain, or early satiety.  All other systems reviewed and are otherwise negative except as noted above.   Cardiac Rehabilitation Eligibility Assessment  The patient is ready to start cardiac rehabilitation from a cardiac standpoint.    Physical Exam    VS:  BP (!) 146/84   Pulse (!) 58   Ht 5\' 7"  (1.702 m)   Wt 141 lb (64 kg)   SpO2 90%   BMI 22.08 kg/m  , BMI Body mass index is 22.08 kg/m.     Vitals:   09/01/22 0957 09/01/22 1047  BP: (!) 144/68 (!) 146/84  Pulse: (!) 58   SpO2: 90%     GEN: Well nourished, well developed, in no acute distress. HEENT: normal. Neck: Supple, no JVD, carotid bruits, or masses. Cardiac: RRR, no murmurs, rubs, or gallops. No clubbing, cyanosis, edema.  Radials 2+/PT 2+ and equal bilaterally. R radial cath site w/o bleeding, bruit, hematoma. Respiratory:  Respirations regular and unlabored, clear to auscultation bilaterally. GI: Soft, nontender, nondistended, BS + x 4. MS: no deformity or atrophy. Skin: warm and dry, no rash. Neuro:  Strength and sensation are intact. Psych: Normal affect.  Accessory Clinical Findings    ECG personally reviewed by me today -sinus bradycardia, 58, no acute ST or T changes.  Lab Results  Component Value Date   WBC 7.4 08/10/2022   HGB 12.9 (L) 08/10/2022   HCT 38.4 (L) 08/10/2022   MCV 97.5 08/10/2022   PLT 221 08/10/2022   Lab  Results  Component Value Date   CREATININE 0.77 08/10/2022   BUN 20 08/10/2022   NA 138 08/10/2022   K 3.8 08/10/2022   CL 109 08/10/2022   CO2 22 08/10/2022   Lab Results  Component Value Date   ALT 20 08/08/2022   AST 24 08/08/2022   ALKPHOS 65 08/08/2022   BILITOT 0.5 08/08/2022   Lab Results  Component Value Date   CHOL 167 08/09/2022   HDL 58 08/09/2022   LDLCALC 83 08/09/2022   TRIG 130 08/09/2022   CHOLHDL 2.9 08/09/2022    Lab Results  Component Value Date   HGBA1C 5.6 08/09/2022   Lab Results  Component Value Date   TSH 1.900 08/08/2022    Assessment & Plan    1.  Non-STEMI, subsequent episode of care/coronary artery disease: Status post hospitalization earlier this month with chest pain and finding of severe circumflex  disease, chronic total occlusion of the right coronary artery, and otherwise moderate nonobstructive disease.  The right coronary artery was supplied via left-to-right collaterals.  The circumflex was felt to be the culprit and this was successfully treated with 2 drug-eluting stents.  He has not had any chest pain or dyspnea and overall, has been feeling well.  He did develop a rash on Plavix and this was discontinued and he was subsequently loaded with Brilinta.  His rash has resolved.  He is interested in cardiac rehabilitation and plans to participate.  We discussed the importance of not stopping any of his medications and notifying us if he is having any adverse reactions.  Continue aspirin, statin, and Brilinta.  No beta-blocker in the setting of baseline bradycardia.  2.  Essential hypertension: Blood pressure elevated today on 2 consecutive readings.  I am adding losartan 25 mg daily.  Plan for follow-up complete metabolic panel in the next 7 to 10 days.  3.  Hyperlipidemia: LDL of 83 during hospitalization.  He was statin nave at that time and is now on atorvastatin 40 mg daily.  Complete metabolic panel and fasting lipids in approximately 7 to  10 days.  4.  Drug rash: This occurred on clopidogrel therapy and resolved following discontinuation.  5.  Hypothyroidism: TSH normal on Synthroid 75 mcg daily.  6.  Disposition: Follow-up complete metabolic panel and fasting lipids in approximately 7 to 10 days.  Follow-up in clinic in 1 month or sooner if necessary.  Murray Hodgkins, NP 09/01/2022, 1:40 PM

## 2022-09-27 ENCOUNTER — Other Ambulatory Visit
Admission: RE | Admit: 2022-09-27 | Discharge: 2022-09-27 | Disposition: A | Discharge status: Home / Self Care | Payer: Medicare Other | Attending: Nurse Practitioner | Admitting: Nurse Practitioner

## 2022-09-27 DIAGNOSIS — I214 Non-ST elevation (NSTEMI) myocardial infarction: Secondary | ICD-10-CM | POA: Diagnosis not present

## 2022-09-27 DIAGNOSIS — I2510 Atherosclerotic heart disease of native coronary artery without angina pectoris: Secondary | ICD-10-CM | POA: Insufficient documentation

## 2022-09-27 DIAGNOSIS — E785 Hyperlipidemia, unspecified: Secondary | ICD-10-CM | POA: Diagnosis not present

## 2022-09-27 LAB — COMPREHENSIVE METABOLIC PANEL
ALT: 18 U/L (ref 0–44)
AST: 22 U/L (ref 15–41)
Albumin: 4.1 g/dL (ref 3.5–5.0)
Alkaline Phosphatase: 60 U/L (ref 38–126)
Anion gap: 6 (ref 5–15)
BUN: 25 mg/dL — ABNORMAL HIGH (ref 8–23)
CO2: 28 mmol/L (ref 22–32)
Calcium: 9.3 mg/dL (ref 8.9–10.3)
Chloride: 107 mmol/L (ref 98–111)
Creatinine, Ser: 0.91 mg/dL (ref 0.61–1.24)
GFR, Estimated: >60 mL/min (ref >60)
Glucose, Bld: 105 mg/dL — ABNORMAL HIGH (ref 70–99)
Potassium: 4.1 mmol/L (ref 3.5–5.1)
Sodium: 141 mmol/L (ref 135–145)
Total Bilirubin: 0.4 mg/dL (ref 0.3–1.2)
Total Protein: 7.3 g/dL (ref 6.5–8.1)

## 2022-09-27 LAB — LIPID PANEL
Cholesterol: 138 mg/dL (ref 0–200)
HDL: 61 mg/dL (ref >40)
LDL Cholesterol: 62 mg/dL (ref 0–99)
Total CHOL/HDL Ratio: 2.3 RATIO
Triglycerides: 75 mg/dL (ref <150)
VLDL: 15 mg/dL (ref 0–40)

## 2022-10-05 ENCOUNTER — Encounter: Payer: Self-pay | Admitting: Nurse Practitioner

## 2022-10-05 ENCOUNTER — Ambulatory Visit: Payer: Medicare Other | Attending: Nurse Practitioner | Admitting: Nurse Practitioner

## 2022-10-05 VITALS — BP 134/88 | HR 68 | Ht 67.0 in | Wt 145.8 lb

## 2022-10-05 DIAGNOSIS — E785 Hyperlipidemia, unspecified: Secondary | ICD-10-CM

## 2022-10-05 DIAGNOSIS — I2510 Atherosclerotic heart disease of native coronary artery without angina pectoris: Secondary | ICD-10-CM

## 2022-10-05 DIAGNOSIS — Z79899 Other long term (current) drug therapy: Secondary | ICD-10-CM | POA: Diagnosis not present

## 2022-10-05 DIAGNOSIS — I10 Essential (primary) hypertension: Secondary | ICD-10-CM | POA: Diagnosis not present

## 2022-10-05 MED ORDER — LOSARTAN POTASSIUM 50 MG PO TABS: 50.0000 mg | ORAL_TABLET | Freq: Every day | ORAL | 3 refills | Status: DC

## 2022-10-05 NOTE — Patient Instructions (Signed)
Medication Instructions:  - Your physician has recommended you make the following change in your medication:   1) INCREASE Losartan to 50 mg:  - take 1 tablet by mouth once daily    *If you need a refill on your cardiac medications before your next appointment, please call your pharmacy*   Lab Work: - Your physician recommends that you return for lab work in: 2 weeks (around 10/19/22).  BMP  [facility] Entrance at [facility] 1st desk on the right to check in (REGISTRATION)  Lab hours: Monday- Friday (7:30 am- 5:30 pm);ab   If you have labs (blood work) drawn today and your tests are completely normal, you will receive your results only by: MyChart Message (if you have MyChart) OR A paper copy in the mail If you have any lab test that is abnormal or we need to change your treatment, we will call you to review the results.   Testing/Procedures: - none ordered   Follow-Up: At [facility], you and your health needs are our priority.  As part of our continuing mission to provide you with exceptional heart care, we have created designated Provider Care Teams.  These Care Teams include your primary Cardiologist (physician) and Advanced Practice Providers (APPs -  Physician Assistants and Nurse Practitioners) who all work together to provide you with the care you need, when you need it.  We recommend signing up for the patient portal called "MyChart".  Sign up information is provided on this After Visit Summary.  MyChart is used to connect with patients for Virtual Visits (Telemedicine).  Patients are able to view lab/test results, encounter notes, upcoming appointments, etc.  Non-urgent messages can be sent to your provider as well.   To learn more about what you can do with MyChart, go to [email or web address].    Your next appointment:   3 month(s)  Provider:   You may see Yvonne Kendall, MD or one of the following Advanced Practice Providers on your designated Care Team:    Nicolasa Ducking, NP   Other Instructions N/a

## 2022-10-05 NOTE — Progress Notes (Signed)
Office Visit    Patient Name: Matthew Peters Date of Encounter: 10/05/2022  Primary Care Provider:  Corky Downs, MD Primary Cardiologist:  Yvonne Kendall, MD  Chief Complaint    67 year old male with a history of oral cancer status post chemoradiation complicated by dysphagia and failure to thrive with placement of G-tube in January 2023, hypothyroidism, CAD status post recent non-STEMI, and hyperlipidemia, who presents for CAD follow-up.  Past Medical History    Past Medical History:  Diagnosis Date   CAD (coronary artery disease)    a. 08/2022 NSTEMI/PCI: LM short/nl, LAD 68m, 65d, D2 90, LCX 95p/m (2.75x26 Onyx Frontier DES), LCX 18m, 67m/d (2.5x8 Onyx Frontier DES), OM3 20, RCA small, 100p/m CTO, RPDA fills via L->R collats from Sept 1/2, RPAV fills via L->R collats from dLCX.   Failure to thrive in adult    s/p G-tube 06/2022 at [facility]   History of echocardiogram    a. 08/2022 Echo: EF 55-60%, no rwma, mild LVH, nl RV fxn, mild MR. Ao sclerosis.   Hyperlipidemia LDL goal <70    Hypothyroidism    Squamous cell carcinoma of trachea (HCC)    Past Surgical History:  Procedure Laterality Date   CORONARY STENT INTERVENTION N/A 08/09/2022   Procedure: CORONARY STENT INTERVENTION;  Surgeon: Yvonne Kendall, MD;  Location: [facility] INVASIVE CV LAB;  Service: Cardiovascular;  Laterality: N/A;   GASTROSTOMY TUBE PLACEMENT     LEFT HEART CATH AND CORONARY ANGIOGRAPHY N/A 08/09/2022   Procedure: LEFT HEART CATH AND CORONARY ANGIOGRAPHY;  Surgeon: Yvonne Kendall, MD;  Location: [facility] IN[facility] LAB;  Service: Cardiovascular;  Laterality: N/A;    Allergies  Allergies  Allergen Reactions   Other Anaphylaxis   Sulfa Antibiotics Anaphylaxis    History of Present Illness    67 year old male with a history of CAD, hyperlipidemia, hypothyroidism, and oral cancer status post chemoradiation complicated by dysphagia and failure to thrive requiring G-tube placement in January 2023.  On August 07, 2022, he began to experience intermittent chest discomfort radiating to his right chest and arm, associate with nausea and diaphoresis.  He presented to the emergency department on March 5 was noted to have inferolateral ST segment depressions.  Troponin peaked at 2331.  Echo showed normal LV function with mild LVH and aortic sclerosis.  Diagnostic catheterization revealed a chronic total occlusion of the right coronary artery with left-to-right collaterals as well as moderate LAD and diagonal disease, and severe circumflex disease.  The circumflex was successfully treated with 2 overlapping drug-eluting stents.  Postprocedure, he was noted to have bradycardia and beta-blocker therapy was held.  He was subsequently discharged on aspirin, Plavix, and statin.  Following discharge, he developed a rash which was felt to be secondary to Plavix, and this was discontinued and he was loaded with ticagrelor with resolution of rash.  At office follow-up on September 01, 2022, patient was feeling well.  Blood pressure was elevated and losartan was added with stable renal function electrolytes and follow-up labs on April 24.  Since his last visit, he has done well.  He has been walking regularly without symptoms or limitations.  Additional losartan has improved his blood pressure though he notes that he continues to occasionally run high at home-in the 150s.  He denies chest pain, palpitations, dyspnea, pnd, orthopnea, n, v, dizziness, syncope, edema, weight gain, or early satiety.   Home Medications    Current Outpatient Medications  Medication Sig Dispense Refill   acetaminophen (TYLENOL) 325  MG tablet Take 650 mg by mouth every 4 (four) hours as needed.     aspirin EC 81 MG tablet Take 1 tablet (81 mg total) by mouth daily. Swallow whole. 30 tablet 12   atorvastatin (LIPITOR) 40 MG tablet Take 1 tablet (40 mg total) by mouth daily. 30 tablet 1   levothyroxine (SYNTHROID) 75 MCG tablet Take 1 tablet by mouth daily.      losartan (COZAAR) 50 MG tablet Take 1 tablet (50 mg total) by mouth daily. 90 tablet 3   nitroGLYCERIN (NITROSTAT) 0.4 MG SL tablet Place 1 tablet (0.4 mg total) under the tongue every 5 (five) minutes as needed for chest pain. 25 tablet 1   ticagrelor (BRILINTA) 90 MG TABS tablet Take 1 tablet (90 mg total) by mouth 2 (two) times daily. 60 tablet 11   Vitamin E (VITAMIN E/D-ALPHA NATURAL) 268 MG (400 UNIT) CAPS Take 1 tablet by mouth daily.     No current facility-administered medications for this visit.     Review of Systems    He denies chest pain, palpitations, dyspnea, pnd, orthopnea, n, v, dizziness, syncope, edema, weight gain, or early satiety.  All other systems reviewed and are otherwise negative except as noted above.   Cardiac Rehabilitation Eligibility Assessment  The patient is ready to start cardiac rehabilitation from a cardiac standpoint.    Physical Exam    VS:  BP 134/88   Pulse 68   Ht 5\' 7"  (1.702 m)   Wt 145 lb 12.8 oz (66.1 kg)   SpO2 99%   BMI 22.84 kg/m  , BMI Body mass index is 22.84 kg/m.     GEN: Well nourished, well developed, in no acute distress. HEENT: normal. Neck: Supple, no JVD, carotid bruits, or masses. Cardiac: RRR, 1/6 systolic ejection murmur at the upper sternal borders, no rubs or gallops. No clubbing, cyanosis, edema.  Radials 2+/PT 2+ and equal bilaterally.  Respiratory:  Respirations regular and unlabored, clear to auscultation bilaterally. GI: Soft, nontender, nondistended, BS + x 4. MS: no deformity or atrophy. Skin: warm and dry, no rash. Neuro:  Strength and sensation are intact. Psych: Normal affect.  Accessory Clinical Findings     Lab Results  Component Value Date   WBC 7.4 08/10/2022   HGB 12.9 (L) 08/10/2022   HCT 38.4 (L) 08/10/2022   MCV 97.5 08/10/2022   PLT 221 08/10/2022   Lab Results  Component Value Date   CREATININE 0.91 09/27/2022   BUN 25 (H) 09/27/2022   NA 141 09/27/2022   K 4.1 09/27/2022    CL 107 09/27/2022   CO2 28 09/27/2022   Lab Results  Component Value Date   ALT 18 09/27/2022   AST 22 09/27/2022   ALKPHOS 60 09/27/2022   BILITOT 0.4 09/27/2022   Lab Results  Component Value Date   CHOL 138 09/27/2022   HDL 61 09/27/2022   LDLCALC 62 09/27/2022   TRIG 75 09/27/2022   CHOLHDL 2.3 09/27/2022    Lab Results  Component Value Date   HGBA1C 5.6 08/09/2022   Lab Results  Component Value Date   TSH 1.900 08/08/2022    Assessment & Plan    1.  Coronary artery disease: Status post non-STEMI in March 2024 with finding of severe circumflex disease and chronic total occlusion of the right coronary artery, and otherwise moderate nonobstructive disease.  The RCA was supplied via left-to-right collaterals.  The circumflex was felt to be the culprit and treated with 2  drug-eluting stents.  He has not had any chest pain or dyspnea and continues to feel well, walking regularly.  He remains interested in cardiac rehabilitation.  He remains an active order and he is cleared to begin exercising with rehab.  Continue aspirin, statin, and Brilinta.  No beta-blocker in the setting of baseline bradycardia.  2.  Essential hypertension: Blood pressure improved since starting losartan though he notes occasional elevation of pressures at home into the 150s.  I will increase losartan to 50 mg daily with plan for follow-up basic metabolic panel in approximately 2 weeks.  3.  Hyperlipidemia: LDL of 62 on follow-up last month.  Normal LFTs.  Continue statin therapy.  4.  Hypothyroidism: TSH normal in March.  Continue levothyroxine.  5.  Disposition: Follow-up basic metabolic panel in 2 weeks.  Follow-up in clinic in 3 months or sooner if necessary.  Nicolasa Ducking, NP 10/05/2022, 12:04 PM

## 2022-11-01 DIAGNOSIS — E039 Hypothyroidism, unspecified: Secondary | ICD-10-CM | POA: Diagnosis not present

## 2022-11-01 DIAGNOSIS — C109 Malignant neoplasm of oropharynx, unspecified: Secondary | ICD-10-CM | POA: Diagnosis not present

## 2022-11-01 DIAGNOSIS — R1310 Dysphagia, unspecified: Secondary | ICD-10-CM | POA: Diagnosis not present

## 2022-11-06 ENCOUNTER — Other Ambulatory Visit: Payer: Self-pay | Admitting: Nurse Practitioner

## 2022-12-14 ENCOUNTER — Encounter: Payer: Self-pay | Admitting: Emergency Medicine

## 2023-01-10 ENCOUNTER — Encounter: Payer: Self-pay | Admitting: Nurse Practitioner

## 2023-01-10 ENCOUNTER — Ambulatory Visit: Payer: Medicare Other | Attending: Nurse Practitioner | Admitting: Nurse Practitioner

## 2023-01-10 VITALS — BP 152/86 | HR 63 | Ht 67.0 in | Wt 147.5 lb

## 2023-01-10 DIAGNOSIS — I739 Peripheral vascular disease, unspecified: Secondary | ICD-10-CM

## 2023-01-10 DIAGNOSIS — I10 Essential (primary) hypertension: Secondary | ICD-10-CM

## 2023-01-10 DIAGNOSIS — I2510 Atherosclerotic heart disease of native coronary artery without angina pectoris: Secondary | ICD-10-CM

## 2023-01-10 DIAGNOSIS — E785 Hyperlipidemia, unspecified: Secondary | ICD-10-CM | POA: Diagnosis not present

## 2023-01-10 MED ORDER — LOSARTAN POTASSIUM 100 MG PO TABS: 100.0000 mg | ORAL_TABLET | Freq: Every day | ORAL | 0 refills | Status: DC

## 2023-01-10 MED ORDER — ATORVASTATIN CALCIUM 40 MG PO TABS: 40.0000 mg | ORAL_TABLET | Freq: Every day | ORAL | 3 refills | Status: DC

## 2023-01-10 NOTE — Patient Instructions (Signed)
Medication Instructions:   INCREASE Losartan 100 mg by mouth twice daily.  *If you need a refill on your cardiac medications before your next appointment, please call your pharmacy*   Lab Work:  Your provider would like for you to return in   to have the following labs drawn:1 WEEK.   BMET  Please go to [facility] [address] ([facility] [address], [place] [address] You do not need an appointment.  They are open from 7:30 am-4 pm.  Lunch from 1:00 pm- 2:00 pm   You may also go to any of these [facility] locations:  [employer]  - [address] [employer] [address] [employer] Officer)      Testing/Procedures:  Your physician has requested that you have an ankle brachial index (ABI). During this test an ultrasound and blood pressure cuff are used to evaluate the arteries that supply the arms and legs with blood. Allow thirty minutes for this exam. There are no restrictions or special instructions.    Follow-Up: At [facility], you and your health needs are our priority.  As part of our continuing mission to provide you with exceptional heart care, we have created designated Provider Care Teams.  These Care Teams include your primary Cardiologist (physician) and Advanced Practice Providers (APPs -  Physician Assistants and Nurse Practitioners) who all work together to provide you with the care you need, when you need it.  We recommend signing up for the patient portal called "MyChart".  Sign up information is provided on this After Visit Summary.  MyChart is used to connect with patients for Virtual Visits (Telemedicine).  Patients are able to view lab/test results, encounter notes, upcoming appointments, etc.  Non-urgent messages can be sent to your provider as well.   To learn more about what you can do with MyChart, go to [email or web address].    Your next appointment:   1 month(s)  Provider:   You may see Yvonne Kendall, MD or one of the  following Advanced Practice Providers on your designated Care Team:   Nicolasa Ducking, NP

## 2023-01-10 NOTE — Progress Notes (Signed)
Office Visit    Patient Name: Matthew Peters Date of Encounter: 01/10/2023  Primary Care Provider:  Corky Downs, MD Primary Cardiologist:  Yvonne Kendall, MD  Chief Complaint    67 y.o. male with a history of oral cancer status post chemoradiation complicated by dysphagia and failure to thrive with placement of G-tube in January 2023, hypothyroidism, CAD status post non-STEMI, and hyperlipidemia, who presents for f/u r/t CAD.  Past Medical History    Past Medical History:  Diagnosis Date   CAD (coronary artery disease)    a. 08/2022 NSTEMI/PCI: LM short/nl, LAD 77m, 65d, D2 90, LCX 95p/m (2.75x26 Onyx Frontier DES), LCX 31m, 38m/d (2.5x8 Onyx Frontier DES), OM3 20, RCA small, 100p/m CTO, RPDA fills via L->R collats from Sept 1/2, RPAV fills via L->R collats from dLCX.   Failure to thrive in adult    s/p G-tube 06/2022 at [facility]   History of echocardiogram    a. 08/2022 Echo: EF 55-60%, no rwma, mild LVH, nl RV fxn, mild MR. Ao sclerosis.   Hyperlipidemia LDL goal <70    Hypothyroidism    Squamous cell carcinoma of trachea (HCC)    Past Surgical History:  Procedure Laterality Date   CORONARY STENT INTERVENTION N/A 08/09/2022   Procedure: CORONARY STENT INTERVENTION;  Surgeon: Yvonne Kendall, MD;  Location: [facility] INVASIVE CV LAB;  Service: Cardiovascular;  Laterality: N/A;   GASTROSTOMY TUBE PLACEMENT     LEFT HEART CATH AND CORONARY ANGIOGRAPHY N/A 08/09/2022   Procedure: LEFT HEART CATH AND CORONARY ANGIOGRAPHY;  Surgeon: Yvonne Kendall, MD;  Location: [facility] INVASIVE CV LAB;  Service: Cardiovascular;  Laterality: N/A;    Allergies  Allergies  Allergen Reactions   Other Anaphylaxis   Sulfa Antibiotics Anaphylaxis    History of Present Illness      67 y.o. y/o male with a history of CAD, hyperlipidemia, hypothyroidism, and oral cancer status post chemoradiation complicated by dysphagia and failure to thrive requiring G-tube placement in January 2023. On August 07, 2022, he  began to experience intermittent chest discomfort radiating to his right chest and arm, associate with nausea and diaphoresis. He presented to the emergency department on March 5 was noted to have inferolateral ST segment depressions. Troponin peaked at 2331. Echo showed normal LV function with mild LVH and aortic sclerosis. Diagnostic catheterization revealed a chronic total occlusion of the right coronary artery with left-to-right collaterals as well as moderate LAD and diagonal disease, and severe circumflex disease. The circumflex was successfully treated with 2 overlapping drug-eluting stents. Postprocedure, he was noted to have bradycardia and beta-blocker therapy was held. He was subsequently discharged on aspirin, Plavix, and statin. Following discharge, he developed a rash which was felt to be secondary to Plavix, and this was discontinued and he was loaded with ticagrelor with resolution of rash.    [name] was last seen in cardiology clinic in 10/2022, at which time he was doing well from a cardiac standpoint.  He remains relatively active without chest pain or dyspnea, though notes that when he walks to his mailbox, that his legs become very tired.  He denies pain or tightness, just significant fatigue in his legs.  He does not have any resting symptoms.  He does not experience palpitations, PND, orthopnea, dizziness, syncope, edema, or early satiety.  Blood pressure elevated today.  He does not routinely check this at home.  Home Medications    Current Outpatient Medications  Medication Sig Dispense Refill   acetaminophen (TYLENOL) 325  MG tablet Take 650 mg by mouth every 4 (four) hours as needed.     aspirin EC 81 MG tablet Take 1 tablet (81 mg total) by mouth daily. Swallow whole. 30 tablet 12   atorvastatin (LIPITOR) 40 MG tablet Take 1 tablet by mouth once daily 30 tablet 2   levothyroxine (SYNTHROID) 75 MCG tablet Take 1 tablet by mouth daily.     losartan (COZAAR) 50 MG tablet Take  1 tablet (50 mg total) by mouth daily. 90 tablet 3   nitroGLYCERIN (NITROSTAT) 0.4 MG SL tablet Place 1 tablet (0.4 mg total) under the tongue every 5 (five) minutes as needed for chest pain. 25 tablet 1   ticagrelor (BRILINTA) 90 MG TABS tablet Take 1 tablet (90 mg total) by mouth 2 (two) times daily. 60 tablet 11   Vitamin E (VITAMIN E/D-ALPHA NATURAL) 268 MG (400 UNIT) CAPS Take 1 tablet by mouth daily.     No current facility-administered medications for this visit.     Review of Systems    Lower extremity fatigue/claudication with ambulation.  He denies chest pain, dyspnea, palpitations, PND, orthopnea, dizziness, syncope, edema, or early satiety.  All other systems reviewed and are otherwise negative except as noted above.    Physical Exam    VS:  BP (!) 152/86 (BP Location: Left Arm, Patient Position: Sitting, Cuff Size: Normal)   Pulse 63   Ht 5\' 7"  (1.702 m)   Wt 147 lb 8 oz (66.9 kg)   SpO2 94%   BMI 23.10 kg/m  , BMI Body mass index is 23.1 kg/m.     Vitals:   01/10/23 1059 01/10/23 1105  BP: (!) 160/92 (!) 152/86  Pulse: 63   SpO2: 94%     GEN: Well nourished, well developed, in no acute distress. HEENT: normal. Neck: Supple, no JVD, carotid bruits, or masses. Cardiac: RRR, no murmurs, rubs, or gallops. No clubbing, cyanosis, edema.  Radials 2+ bilaterally.  PT 2+ on right, 1+ on the left.  Respiratory:  Respirations regular and unlabored, clear to auscultation bilaterally. GI: Soft, nontender, nondistended, BS + x 4. MS: no deformity or atrophy. Skin: warm and dry, no rash. Neuro:  Strength and sensation are intact. Psych: Normal affect.  Accessory Clinical Findings    Lab Results  Component Value Date   WBC 7.4 08/10/2022   HGB 12.9 (L) 08/10/2022   HCT 38.4 (L) 08/10/2022   MCV 97.5 08/10/2022   PLT 221 08/10/2022   Lab Results  Component Value Date   CREATININE 0.91 09/27/2022   BUN 25 (H) 09/27/2022   NA 141 09/27/2022   K 4.1 09/27/2022   CL  107 09/27/2022   CO2 28 09/27/2022   Lab Results  Component Value Date   ALT 18 09/27/2022   AST 22 09/27/2022   ALKPHOS 60 09/27/2022   BILITOT 0.4 09/27/2022   Lab Results  Component Value Date   CHOL 138 09/27/2022   HDL 61 09/27/2022   LDLCALC 62 09/27/2022   TRIG 75 09/27/2022   CHOLHDL 2.3 09/27/2022    Lab Results  Component Value Date   HGBA1C 5.6 08/09/2022    Assessment & Plan    1.  Coronary artery disease: Status post non-STEMI in March 2024 with findings of severe circumflex disease and chronic total occlusion of the right coronary artery, and otherwise moderate, nonobstructive disease.  The RCA was supplied via left-to-right collaterals.  The circumflex felt to be the culprit and was treated with 2  drug-eluting stents.  He continues to do well without chest pain or dyspnea and remains reasonably active.  Has had some leg fatigue potentially consistent with claudication with plan for ABIs as outlined below.  Continue aspirin, statin, ARB, and Brilinta therapy.  2.  Primary hypertension: Blood pressure remains elevated again today in the 150s and 160s.  He is not taking his blood pressure at home.  I am increasing his losartan to 100 mg daily with plan for follow-up visit metabolic panel in 1 week.    3.  Hyperlipidemia: LDL 62 in April with normal LFTs.  Continue statin therapy.  4.  Hypothyroidism: On levothyroxine with normal TSH earlier this year.  5. Claudication:  Pt has been having bilateral leg fatigue when walking to and from his mailbox recently.  With cardiac hx, I will obtain ABIs.  6.  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  G-tube removed earlier this year.  7.  Disposition: Follow-up basic metabolic panel in 1 week.  Follow-up ABIs.  Follow-up in clinic in 1 month or sooner if necessary.  Nicolasa Ducking, NP 01/10/2023, 11:18 AM

## 2023-01-11 ENCOUNTER — Ambulatory Visit: Payer: Medicare Other | Attending: Nurse Practitioner

## 2023-01-11 DIAGNOSIS — I2510 Atherosclerotic heart disease of native coronary artery without angina pectoris: Secondary | ICD-10-CM | POA: Diagnosis not present

## 2023-01-11 DIAGNOSIS — I739 Peripheral vascular disease, unspecified: Secondary | ICD-10-CM | POA: Diagnosis not present

## 2023-01-11 LAB — VAS US ABI WITH/WO TBI
Left ABI: 1.1
Right ABI: 0.99

## 2023-01-16 ENCOUNTER — Telehealth: Payer: Self-pay | Admitting: Nurse Practitioner

## 2023-01-16 MED ORDER — LOSARTAN POTASSIUM 50 MG PO TABS: 50.0000 mg | ORAL_TABLET | Freq: Every day | ORAL | 3 refills | Status: DC

## 2023-01-16 NOTE — Telephone Encounter (Signed)
Patients wife states the dizziness started to occur after the dosage of losartan was increased from 50 mg to 100 mg. She states he stopped taking the medication today and will not be taking it again due to him feeling dizzy. Asked patients wife if they have been checking his BP and she states his BP hasn't been high or low. She states she can't remember what the bottom number was but his top BP number last night was 118. Advised patients wife to keep a record of his BP reading.

## 2023-01-16 NOTE — Telephone Encounter (Signed)
Pt's wife made aware of NP's recommendations and verbalized understanding.

## 2023-01-16 NOTE — Telephone Encounter (Signed)
Ok to reduce losartan back to 50 mg daily and follow BP's at home.

## 2023-01-16 NOTE — Telephone Encounter (Signed)
Pt c/o medication issue:  1. Name of Medication: Losartan 100 mg  2. How are you currently taking this medication (dosage and times per day)?   3. Are you having a reaction (difficulty breathing--STAT)?   4. What is your medication issue?  Patient's wife says it is making him so dizzy and really tired- he have not taken any medicine today. She says this medicine needs to be changed

## 2023-01-17 ENCOUNTER — Observation Stay
Admission: EM | Admit: 2023-01-17 | Discharge: 2023-01-18 | Disposition: A | Discharge status: Home / Self Care | Payer: Medicare Other | Attending: Student in an Organized Health Care Education/Training Program | Admitting: Student in an Organized Health Care Education/Training Program

## 2023-01-17 ENCOUNTER — Encounter: Payer: Self-pay | Admitting: Emergency Medicine

## 2023-01-17 ENCOUNTER — Other Ambulatory Visit: Payer: Self-pay

## 2023-01-17 ENCOUNTER — Emergency Department: Payer: Medicare Other

## 2023-01-17 DIAGNOSIS — I10 Essential (primary) hypertension: Secondary | ICD-10-CM | POA: Insufficient documentation

## 2023-01-17 DIAGNOSIS — Z7982 Long term (current) use of aspirin: Secondary | ICD-10-CM | POA: Insufficient documentation

## 2023-01-17 DIAGNOSIS — A419 Sepsis, unspecified organism: Secondary | ICD-10-CM | POA: Diagnosis not present

## 2023-01-17 DIAGNOSIS — Z743 Need for continuous supervision: Secondary | ICD-10-CM | POA: Diagnosis not present

## 2023-01-17 DIAGNOSIS — Z87891 Personal history of nicotine dependence: Secondary | ICD-10-CM | POA: Diagnosis not present

## 2023-01-17 DIAGNOSIS — E872 Acidosis, unspecified: Secondary | ICD-10-CM | POA: Insufficient documentation

## 2023-01-17 DIAGNOSIS — U071 COVID-19: Principal | ICD-10-CM | POA: Insufficient documentation

## 2023-01-17 DIAGNOSIS — C4442 Squamous cell carcinoma of skin of scalp and neck: Secondary | ICD-10-CM | POA: Diagnosis present

## 2023-01-17 DIAGNOSIS — I2510 Atherosclerotic heart disease of native coronary artery without angina pectoris: Secondary | ICD-10-CM

## 2023-01-17 DIAGNOSIS — Z79899 Other long term (current) drug therapy: Secondary | ICD-10-CM | POA: Diagnosis not present

## 2023-01-17 DIAGNOSIS — E032 Hypothyroidism due to medicaments and other exogenous substances: Secondary | ICD-10-CM | POA: Diagnosis present

## 2023-01-17 DIAGNOSIS — N179 Acute kidney failure, unspecified: Secondary | ICD-10-CM

## 2023-01-17 DIAGNOSIS — R531 Weakness: Secondary | ICD-10-CM | POA: Diagnosis present

## 2023-01-17 DIAGNOSIS — Z8512 Personal history of malignant neoplasm of trachea: Secondary | ICD-10-CM | POA: Diagnosis not present

## 2023-01-17 DIAGNOSIS — J189 Pneumonia, unspecified organism: Secondary | ICD-10-CM | POA: Diagnosis not present

## 2023-01-17 DIAGNOSIS — E039 Hypothyroidism, unspecified: Secondary | ICD-10-CM | POA: Diagnosis not present

## 2023-01-17 DIAGNOSIS — I959 Hypotension, unspecified: Secondary | ICD-10-CM | POA: Diagnosis not present

## 2023-01-17 DIAGNOSIS — Z95810 Presence of automatic (implantable) cardiac defibrillator: Secondary | ICD-10-CM | POA: Diagnosis not present

## 2023-01-17 DIAGNOSIS — J1282 Pneumonia due to coronavirus disease 2019: Secondary | ICD-10-CM | POA: Diagnosis not present

## 2023-01-17 DIAGNOSIS — R918 Other nonspecific abnormal finding of lung field: Secondary | ICD-10-CM | POA: Diagnosis not present

## 2023-01-17 DIAGNOSIS — R000 Tachycardia, unspecified: Secondary | ICD-10-CM | POA: Diagnosis not present

## 2023-01-17 DIAGNOSIS — R0902 Hypoxemia: Secondary | ICD-10-CM | POA: Diagnosis not present

## 2023-01-17 LAB — COMPREHENSIVE METABOLIC PANEL
ALT: 14 U/L (ref 0–44)
AST: 19 U/L (ref 15–41)
Albumin: 3.6 g/dL (ref 3.5–5.0)
Alkaline Phosphatase: 65 U/L (ref 38–126)
Anion gap: 11 (ref 5–15)
BUN: 33 mg/dL — ABNORMAL HIGH (ref 8–23)
CO2: 19 mmol/L — ABNORMAL LOW (ref 22–32)
Calcium: 9 mg/dL (ref 8.9–10.3)
Chloride: 108 mmol/L (ref 98–111)
Creatinine, Ser: 1.62 mg/dL — ABNORMAL HIGH (ref 0.61–1.24)
GFR, Estimated: 46 mL/min — ABNORMAL LOW (ref >60)
Glucose, Bld: 161 mg/dL — ABNORMAL HIGH (ref 70–99)
Potassium: 3.6 mmol/L (ref 3.5–5.1)
Sodium: 138 mmol/L (ref 135–145)
Total Bilirubin: 1.5 mg/dL — ABNORMAL HIGH (ref 0.3–1.2)
Total Protein: 7.3 g/dL (ref 6.5–8.1)

## 2023-01-17 LAB — CBC WITH DIFFERENTIAL/PLATELET
Abs Immature Granulocytes: 0.19 10*3/uL — ABNORMAL HIGH (ref 0.00–0.07)
Basophils Absolute: 0.1 10*3/uL (ref 0.0–0.1)
Basophils Relative: 0 %
Eosinophils Absolute: 0.4 10*3/uL (ref 0.0–0.5)
Eosinophils Relative: 3 %
HCT: 42.5 % (ref 39.0–52.0)
Hemoglobin: 13.8 g/dL (ref 13.0–17.0)
Immature Granulocytes: 1 %
Lymphocytes Relative: 4 %
Lymphs Abs: 0.5 10*3/uL — ABNORMAL LOW (ref 0.7–4.0)
MCH: 31.9 pg (ref 26.0–34.0)
MCHC: 32.5 g/dL (ref 30.0–36.0)
MCV: 98.4 fL (ref 80.0–100.0)
Monocytes Absolute: 1.3 10*3/uL — ABNORMAL HIGH (ref 0.1–1.0)
Monocytes Relative: 10 %
Neutro Abs: 11.1 10*3/uL — ABNORMAL HIGH (ref 1.7–7.7)
Neutrophils Relative %: 82 %
Platelets: 243 10*3/uL (ref 150–400)
RBC: 4.32 MIL/uL (ref 4.22–5.81)
RDW: 13.9 % (ref 11.5–15.5)
Smear Review: NORMAL
WBC: 13.4 10*3/uL — ABNORMAL HIGH (ref 4.0–10.5)
nRBC: 0 % (ref 0.0–0.2)

## 2023-01-17 LAB — LACTIC ACID, PLASMA
Lactic Acid, Venous: 2.3 mmol/L (ref 0.5–1.9)
Lactic Acid, Venous: 3.3 mmol/L (ref 0.5–1.9)

## 2023-01-17 LAB — PROTIME-INR
INR: 1.2 (ref 0.8–1.2)
Prothrombin Time: 15.7 seconds — ABNORMAL HIGH (ref 11.4–15.2)

## 2023-01-17 LAB — TROPONIN I (HIGH SENSITIVITY): Troponin I (High Sensitivity): 14 ng/L (ref <18)

## 2023-01-17 LAB — SARS CORONAVIRUS 2 BY RT PCR: SARS Coronavirus 2 by RT PCR: POSITIVE — AB

## 2023-01-17 MED ORDER — LACTATED RINGERS IV SOLN
150.0000 mL/h | INTRAVENOUS | Status: DC
  Administered 2023-01-18: 150 mL/h via INTRAVENOUS

## 2023-01-17 MED ORDER — LACTATED RINGERS IV BOLUS (SEPSIS)
1000.0000 mL | Freq: Once | INTRAVENOUS | Status: AC
  Administered 2023-01-17: 1000 mL via INTRAVENOUS

## 2023-01-17 MED ORDER — SODIUM CHLORIDE 0.9 % IV SOLN
2.0000 g | INTRAVENOUS | Status: DC
  Administered 2023-01-17: 2 g via INTRAVENOUS
  Filled 2023-01-17: qty 20

## 2023-01-17 MED ORDER — ENOXAPARIN SODIUM 40 MG/0.4ML IJ SOSY
40.0000 mg | PREFILLED_SYRINGE | INTRAMUSCULAR | Status: DC
  Administered 2023-01-18: 40 mg via SUBCUTANEOUS
  Filled 2023-01-17: qty 0.4

## 2023-01-17 MED ORDER — SODIUM CHLORIDE 0.9 % IV SOLN
500.0000 mg | INTRAVENOUS | Status: DC
  Administered 2023-01-17: 500 mg via INTRAVENOUS
  Filled 2023-01-17: qty 5

## 2023-01-17 MED ORDER — SODIUM CHLORIDE 0.9 % IV BOLUS
500.0000 mL | Freq: Once | INTRAVENOUS | Status: AC
  Administered 2023-01-17: 500 mL via INTRAVENOUS

## 2023-01-17 MED ORDER — ONDANSETRON HCL 4 MG PO TABS: 4.0000 mg | ORAL_TABLET | Freq: Four times a day (QID) | ORAL | Status: DC | PRN

## 2023-01-17 MED ORDER — ACETAMINOPHEN 325 MG PO TABS: 650.0000 mg | ORAL_TABLET | Freq: Four times a day (QID) | ORAL | Status: DC | PRN

## 2023-01-17 MED ORDER — LACTATED RINGERS IV BOLUS (SEPSIS)
250.0000 mL | Freq: Once | INTRAVENOUS | Status: AC
  Administered 2023-01-17: 250 mL via INTRAVENOUS

## 2023-01-17 MED ORDER — SODIUM CHLORIDE 0.9 % IV SOLN: Freq: Once | INTRAVENOUS | Status: DC

## 2023-01-17 MED ORDER — SODIUM CHLORIDE 0.9 % IV SOLN
500.0000 mg | INTRAVENOUS | Status: DC
  Filled 2023-01-17: qty 5

## 2023-01-17 MED ORDER — ONDANSETRON HCL 4 MG/2ML IJ SOLN: 4.0000 mg | Freq: Four times a day (QID) | INTRAMUSCULAR | Status: DC | PRN

## 2023-01-17 MED ORDER — SODIUM CHLORIDE 0.9 % IV SOLN
2.0000 g | INTRAVENOUS | Status: DC
  Filled 2023-01-17: qty 20

## 2023-01-17 MED ORDER — ACETAMINOPHEN 650 MG RE SUPP: 650.0000 mg | Freq: Four times a day (QID) | RECTAL | Status: DC | PRN

## 2023-01-17 NOTE — Consult Note (Signed)
CODE SEPSIS - PHARMACY COMMUNICATION  **Broad Spectrum Antibiotics should be administered within 1 hour of Sepsis diagnosis**  Time Code Sepsis Called/Page Received: 8657  Antibiotics Ordered: ceftriaxone + azithromycin  Time of 1st antibiotic administration: 2115  Additional action taken by pharmacy: messaged RN  If necessary, Name of Provider/Nurse Contacted: Barney Drain    Ronnald Ramp ,PharmD Clinical Pharmacist  01/17/2023  9:18 PM

## 2023-01-17 NOTE — Assessment & Plan Note (Signed)
COVID-19 pneumonia with suspected secondary bacterial infection Sepsis criteria include tachycardia, tachypnea with hypotension, leukocytosis with lactic acidosis and AKI Sepsis fluids Rocephin and azithromycin Incentive spirometry, antitussives Supplemental O2 if needed, albuterol as needed COVID precautions Patient 1 week out from onset of symptoms so no COVID antivirals ordered.  Not hypoxic so no steroids ordered

## 2023-01-17 NOTE — H&P (Signed)
History and Physical    Patient: Matthew Peters NFA:[id number] DOB: 17-Aug-1955 DOA: 01/17/2023 DOS: the patient was seen and examined on 01/17/2023 PCP: Corky Downs, MD  Patient coming from: Home  Chief Complaint:  Chief Complaint  Patient presents with   Weakness    HPI: Matthew Peters is a 67 y.o. male with medical history significant for SCC of the head and neck status post CCRT, hypothyroidism, CAD with STEMI March 2024 s/p stent to left circumflex for 95% lesion who presents to the ED with a 1 week history of generalized weakness and decreased oral intake, now associated with a productive cough over the past couple days.  He has no fever, chills or chest pain and no lower extremity pain or swelling.ED course and data review: Afebrile on arrival but hypotensive to 92/65, tachycardic to 124 and tachypneic to 26 but maintaining sats in the high 90s on room air. Labs significant for WBC 13,000 with lactic acid 3.3.  Creatinine 1.62 up from baseline of 0.77 with mild metabolic acidosis with bicarb of 19.  COVID-positive.  Troponin 14. EKG, personally viewed and interpreted showing sinus tachycardia at 119 with nonspecific ST-T wave changes. Chest x-ray shows left upper lobe airspace opacity favoring pneumonia Patient received 1750 mL fluid bolus started on Rocephin and azithromycin. Hospitalist consulted for admission.   Review of Systems: As mentioned in the history of present illness. All other systems reviewed and are negative.  Past Medical History:  Diagnosis Date   CAD (coronary artery disease)    a. 08/2022 NSTEMI/PCI: LM short/nl, LAD 71m, 65d, D2 90, LCX 95p/m (2.75x26 Onyx Frontier DES), LCX 24m, 27m/d (2.5x8 Onyx Frontier DES), OM3 20, RCA small, 100p/m CTO, RPDA fills via L->R collats from Sept 1/2, RPAV fills via L->R collats from dLCX.   Failure to thrive in adult    s/p G-tube 06/2022 at [facility]   History of echocardiogram    a. 08/2022 Echo: EF 55-60%, no rwma, mild LVH, nl RV  fxn, mild MR. Ao sclerosis.   Hyperlipidemia LDL goal <70    Hypothyroidism    Squamous cell carcinoma of trachea (HCC)    Past Surgical History:  Procedure Laterality Date   CORONARY STENT INTERVENTION N/A 08/09/2022   Procedure: CORONARY STENT INTERVENTION;  Surgeon: Yvonne Kendall, MD;  Location: [facility] INVASIVE CV LAB;  Service: Cardiovascular;  Laterality: N/A;   GASTROSTOMY TUBE PLACEMENT     LEFT HEART CATH AND CORONARY ANGIOGRAPHY N/A 08/09/2022   Procedure: LEFT HEART CATH AND CORONARY ANGIOGRAPHY;  Surgeon: Yvonne Kendall, MD;  Location: [facility] INVASIVE CV LAB;  Service: Cardiovascular;  Laterality: N/A;   Social History:  reports that he quit smoking about 7 months ago. His smoking use included cigarettes. He started smoking about 50 years ago. He has a 75 pack-year smoking history. He has never used smokeless tobacco. He reports that he does not drink alcohol and does not use drugs.  Allergies  Allergen Reactions   Other Anaphylaxis   Sulfa Antibiotics Anaphylaxis    Family History  Problem Relation Age of Onset   Heart disease Mother        multiple stent beginning in her mid-70's    Prior to Admission medications   Medication Sig Start Date End Date Taking? Authorizing Provider  acetaminophen (TYLENOL) 325 MG tablet Take 650 mg by mouth every 4 (four) hours as needed. 09/03/20   [provider]  aspirin EC 81 MG tablet Take 1 tablet (81 mg total) by mouth  daily. Swallow whole. 08/11/22   Tresa Moore, MD  atorvastatin (LIPITOR) 40 MG tablet Take 1 tablet (40 mg total) by mouth daily. 01/10/23   Creig Hines, NP  levothyroxine (SYNTHROID) 75 MCG tablet Take 1 tablet by mouth daily. 02/27/22 02/27/23  [provider]  losartan (COZAAR) 50 MG tablet Take 1 tablet (50 mg total) by mouth daily. 01/16/23 04/16/23  Creig Hines, NP  nitroGLYCERIN (NITROSTAT) 0.4 MG SL tablet Place 1 tablet (0.4 mg total) under the tongue every 5 (five)  minutes as needed for chest pain. 09/01/22 01/10/23  Creig Hines, NP  ticagrelor (BRILINTA) 90 MG TABS tablet Take 1 tablet (90 mg total) by mouth 2 (two) times daily. 08/14/22   End, Cristal Deer, MD  Vitamin E (VITAMIN E/D-ALPHA NATURAL) 268 MG (400 UNIT) CAPS Take 1 tablet by mouth daily. 01/18/22 01/18/23  [provider]    Physical Exam: Vitals:   01/17/23 1739 01/17/23 1741 01/17/23 2124  BP:  92/65 90/71  Pulse:  (!) 124 73  Resp:  (!) 26 18  Temp:  98 F (36.7 C) (!) 97.4 F (36.3 C)  TempSrc:   Oral  SpO2:  97% 98%  Weight: 66.7 kg    Height: 5\' 7"  (1.702 m)     Physical Exam Vitals and nursing note reviewed.  Constitutional:      General: He is not in acute distress. HENT:     Head: Normocephalic and atraumatic.  Cardiovascular:     Rate and Rhythm: Regular rhythm. Tachycardia present.     Heart sounds: Normal heart sounds.  Pulmonary:     Effort: Tachypnea present.     Breath sounds: Wheezing present.  Abdominal:     Palpations: Abdomen is soft.     Tenderness: There is no abdominal tenderness.  Neurological:     Mental Status: Mental status is at baseline.     Labs on Admission: I have personally reviewed following labs and imaging studies  CBC: Recent Labs  Lab 01/17/23 1758  WBC 13.4*  NEUTROABS 11.1*  HGB 13.8  HCT 42.5  MCV 98.4  PLT 243   Basic Metabolic Panel: Recent Labs  Lab 01/17/23 1758  NA 138  K 3.6  CL 108  CO2 19*  GLUCOSE 161*  BUN 33*  CREATININE 1.62*  CALCIUM 9.0   GFR: Estimated Creatinine Clearance: 40.8 mL/min (A) (by C-G formula based on SCr of 1.62 mg/dL (H)). Liver Function Tests: Recent Labs  Lab 01/17/23 1758  AST 19  ALT 14  ALKPHOS 65  BILITOT 1.5*  PROT 7.3  ALBUMIN 3.6   No results for input(s): "LIPASE", "AMYLASE" in the last 168 hours. No results for input(s): "AMMONIA" in the last 168 hours. Coagulation Profile: Recent Labs  Lab 01/17/23 1758  INR 1.2   Cardiac  Enzymes: No results for input(s): "CKTOTAL", "CKMB", "CKMBINDEX", "TROPONINI" in the last 168 hours. BNP (last 3 results) No results for input(s): "PROBNP" in the last 8760 hours. HbA1C: No results for input(s): "HGBA1C" in the last 72 hours. CBG: No results for input(s): "GLUCAP" in the last 168 hours. Lipid Profile: No results for input(s): "CHOL", "HDL", "LDLCALC", "TRIG", "CHOLHDL", "LDLDIRECT" in the last 72 hours. Thyroid Function Tests: No results for input(s): "TSH", "T4TOTAL", "FREET4", "T3FREE", "THYROIDAB" in the last 72 hours. Anemia Panel: No results for input(s): "VITAMINB12", "FOLATE", "FERRITIN", "TIBC", "IRON", "RETICCTPCT" in the last 72 hours. Urine analysis:    Component Value Date/Time   COLORURINE Yellow 08/06/2013 0225  APPEARANCEUR Hazy 08/06/2013 0225   LABSPEC 1.032 08/06/2013 0225   PHURINE 5.0 08/06/2013 0225   GLUCOSEU Negative 08/06/2013 0225   HGBUR 2+ 08/06/2013 0225   BILIRUBINUR Negative 08/06/2013 0225   KETONESUR Negative 08/06/2013 0225   PROTEINUR 30 mg/dL 40/98/1191 4782   NITRITE Negative 08/06/2013 0225   LEUKOCYTESUR Negative 08/06/2013 0225    Radiological Exams on Admission: DG Chest 2 View  Result Date: 01/17/2023 CLINICAL DATA:  Altered level of consciousness, potential sepsis EXAM: CHEST - 2 VIEW COMPARISON:  08/08/2022 FINDINGS: Consolidation posteriorly in the left upper lobe favoring pneumonia, best appreciated on the lateral projection. The right lung appears clear. Cardiac and mediastinal margins appear normal. No blunting of the costophrenic angles. IMPRESSION: 1. Left upper lobe airspace opacity favoring pneumonia. Electronically Signed   By: Gaylyn Rong M.D.   On: 01/17/2023 18:39     Data Reviewed: Relevant notes from primary care and specialist visits, past discharge summaries as available in EHR, including Care Everywhere. Prior diagnostic testing as pertinent to current admission diagnoses Updated medications  and problem lists for reconciliation ED course, including vitals, labs, imaging, treatment and response to treatment Triage notes, nursing and pharmacy notes and ED provider's notes Notable results as noted in HPI   Assessment and Plan: * Severe sepsis due to pneumonia (HCC) COVID-19 pneumonia with suspected secondary bacterial infection Sepsis criteria include tachycardia, tachypnea with hypotension, leukocytosis with lactic acidosis and AKI Sepsis fluids Rocephin and azithromycin Incentive spirometry, antitussives Supplemental O2 if needed, albuterol as needed COVID precautions Patient 1 week out from onset of symptoms so no COVID antivirals ordered.  Not hypoxic so no steroids ordered   Hypotension Secondary to sepsis, poor oral intake/dehydration Hold BP lowering meds like losartan until improved  AKI (acute kidney injury) (HCC) Metabolic acidosis Secondary to sepsis and poor oral intake, hypotension Creatinine 1.62, up from baseline of 0.77 with bicarb 19 Expecting improvement with sepsis fluid resuscitation  CAD S/P percutaneous coronary angioplasty 08/2022 NSTEMI with 95% occlusion of left circumflex s/p stent 08/2022 Continue , atorvastatin, aspirin with nitroglycerin SL as needed chest pain Hold losartan until BP recovers  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 Continue levothyroxine  Squamous cell carcinoma of head and neck No acute disused suspected    DVT prophylaxis: Lovenox  Consults: none  Advance Care Planning:   Code Status: Prior   Family Communication: none  Disposition Plan: Back to previous home environment  Severity of Illness: The appropriate patient status for this patient is INPATIENT. Inpatient status is judged to be reasonable and necessary in order to provide the required intensity of service to ensure the patient's safety. The patient's presenting symptoms, physical exam findings, and initial radiographic and laboratory  data in the context of their chronic comorbidities is felt to place them at high risk for further clinical deterioration. Furthermore, it is not anticipated that the patient will be medically stable for discharge from the hospital within 2 midnights of admission.   * I certify that at the point of admission it is my clinical judgment that the patient will require inpatient hospital care spanning beyond 2 midnights from the point of admission due to high intensity of service, high risk for further deterioration and high frequency of surveillance required.*  Author: Andris Baumann, MD 01/17/2023 11:27 PM  For on call review [email or web address].

## 2023-01-17 NOTE — ED Provider Notes (Signed)
[facility] Provider Note    Event Date/Time   First MD Initiated Contact with Patient 01/17/23 2014     (approximate)   History   Weakness   HPI  Matthew Peters is a 67 y.o. male  with PMHx oral CA, here with cough, weakness. Pt reports approx 1 week of worsening generalized weakness, cough, and SOB. He has had poor appetite as well and has been increasingly weak. He was too weak to even walk across the room today. H/o prior oral CA and just had his feeding tube removed recently. No other complaints. H/o smoking in past, wife believes he still smokes a small amount.       Physical Exam   Triage Vital Signs: ED Triage Vitals  Encounter Vitals Group     BP 01/17/23 1741 92/65     Systolic BP Percentile --      Diastolic BP Percentile --      Pulse Rate 01/17/23 1741 (!) 124     Resp 01/17/23 1741 (!) 26     Temp 01/17/23 1741 98 F (36.7 C)     Temp src --      SpO2 01/17/23 1741 97 %     Weight 01/17/23 1739 147 lb (66.7 kg)     Height 01/17/23 1739 5\' 7"  (1.702 m)     Head Circumference --      Peak Flow --      Pain Score 01/17/23 1738 0     Pain Loc --      Pain Education --      Exclude from Growth Chart --     Most recent vital signs: Vitals:   01/17/23 1741  BP: 92/65  Pulse: (!) 124  Resp: (!) 26  Temp: 98 F (36.7 C)  SpO2: 97%     General: Awake, no distress.  CV:  Good peripheral perfusion. Tachycardic, regular. Resp:  Normal work of breathing. Diminished aeration diffusely, with rales in LUL. Abd:  No distention. No tenderness. Other:  Dry MM. Fatigued-appearing.   ED Results / Procedures / Treatments   Labs (all labs ordered are listed, but only abnormal results are displayed) Labs Reviewed  COMPREHENSIVE METABOLIC PANEL - Abnormal; Notable for the following components:      Result Value   CO2 19 (*)    Glucose, Bld 161 (*)    BUN 33 (*)    Creatinine, Ser 1.62 (*)    Total Bilirubin 1.5 (*)    GFR,  Estimated 46 (*)    All other components within normal limits  LACTIC ACID, PLASMA - Abnormal; Notable for the following components:   Lactic Acid, Venous 2.3 (*)    All other components within normal limits  CBC WITH DIFFERENTIAL/PLATELET - Abnormal; Notable for the following components:   WBC 13.4 (*)    Neutro Abs 11.1 (*)    Lymphs Abs 0.5 (*)    Monocytes Absolute 1.3 (*)    Abs Immature Granulocytes 0.19 (*)    All other components within normal limits  PROTIME-INR - Abnormal; Notable for the following components:   Prothrombin Time 15.7 (*)    All other components within normal limits  CULTURE, BLOOD (ROUTINE X 2)  CULTURE, BLOOD (ROUTINE X 2)  SARS CORONAVIRUS 2 BY RT PCR  LACTIC ACID, PLASMA  URINALYSIS, W/ REFLEX TO CULTURE (INFECTION SUSPECTED)  TROPONIN I (HIGH SENSITIVITY)     EKG Sinus tachycardia, VR 119. PR 130, QRS 78, QTc  469. ST depression in inferior and precordial leads. No ST elevations.   RADIOLOGY CXR: LUL opacity favoring PNA   I also independently reviewed and agree with radiologist interpretations.   PROCEDURES:  Critical Care performed: Yes, see critical care procedure note(s)  .Critical Care  Performed by: Shaune Pollack, MD Authorized by: Shaune Pollack, MD   Critical care provider statement:    Critical care time (minutes):  30   Critical care time was exclusive of:  Separately billable procedures and treating other patients   Critical care was necessary to treat or prevent imminent or life-threatening deterioration of the following conditions:  Cardiac failure, circulatory failure, sepsis and respiratory failure   Critical care was time spent personally by me on the following activities:  Development of treatment plan with patient or surrogate, discussions with consultants, evaluation of patient's response to treatment, examination of patient, ordering and review of laboratory studies, ordering and review of radiographic studies,  ordering and performing treatments and interventions, pulse oximetry, re-evaluation of patient's condition and review of old charts     MEDICATIONS ORDERED IN ED: Medications  lactated ringers bolus 1,000 mL (has no administration in time range)    And  lactated ringers bolus 1,000 mL (has no administration in time range)    And  lactated ringers bolus 250 mL (has no administration in time range)  cefTRIAXone (ROCEPHIN) 2 g in sodium chloride 0.9 % 100 mL IVPB (2 g Intravenous New Bag/Given 01/17/23 2115)  azithromycin (ZITHROMAX) 500 mg in sodium chloride 0.9 % 250 mL IVPB (500 mg Intravenous New Bag/Given 01/17/23 2118)     IMPRESSION / MDM / ASSESSMENT AND PLAN / ED COURSE  I reviewed the triage vital signs and the nursing notes.                              Differential diagnosis includes, but is not limited to, sepsis 2/2 PNA, COVID-19, aspiration, pharyngitis, weakness 2/2 anemia, deconditioning, malnutrition, ACS, CHF  Patient's presentation is most consistent with acute presentation with potential threat to life or bodily function.  The patient is on the cardiac monitor to evaluate for evidence of arrhythmia and/or significant heart rate changes  67 yo M  with h/o oral CA here with generalized weakness, cough. H/o CAD, HLD, SCCa of Trachea. Pt tachycardic, hypotensive, tachypneic on arrival. Imaging shows LUL PNA and labs remarkable for leukocytosis with shift, lactic acidosis, and AKI. Will cover empirically for sepsis 2/2 PNA. Broad spectrum ABX started with 30 cc/kg fluids. Admit to medicine. EKG shows diffuse ST depressions, btu trop normal compared to >2000 on last admission and he has no CP.   FINAL CLINICAL IMPRESSION(S) / ED DIAGNOSES   Final diagnoses:  Sepsis due to pneumonia [facility])     Rx / DC Orders   ED Discharge Orders     None        Note:  This document was prepared using Dragon voice recognition software and may include unintentional dictation  errors.   Shaune Pollack, MD 01/17/23 2125

## 2023-01-17 NOTE — ED Triage Notes (Signed)
Pt via [facility]  from home. Per family, pt had an increase in his BP medication and since then pt has been extremely weak and had a loss of appetite. States that the medication went from "50mg  to 100mg " but unable to tell me which medication it is. Denies any pain. Pt has a new productive cough. Pt is A&Ox4 but slow to respond.

## 2023-01-17 NOTE — Sepsis Progress Note (Signed)
Elink monitoring for the code sepsis protocol.  

## 2023-01-17 NOTE — Assessment & Plan Note (Addendum)
NSTEMI with 95% occlusion of left circumflex s/p stent 08/2022 Continue , atorvastatin, aspirin with nitroglycerin SL as needed chest pain Hold losartan until BP recovers

## 2023-01-17 NOTE — Assessment & Plan Note (Addendum)
Metabolic acidosis Secondary to sepsis and poor oral intake, hypotension Creatinine 1.62, up from baseline of 0.77 with bicarb 19 Expecting improvement with sepsis fluid resuscitation

## 2023-01-17 NOTE — Assessment & Plan Note (Signed)
No acute disused suspected 

## 2023-01-17 NOTE — Assessment & Plan Note (Signed)
Secondary to sepsis, poor oral intake/dehydration Hold BP lowering meds like losartan until improved

## 2023-01-17 NOTE — Assessment & Plan Note (Signed)
Continue levothyroxine 

## 2023-01-18 DIAGNOSIS — J189 Pneumonia, unspecified organism: Secondary | ICD-10-CM | POA: Diagnosis not present

## 2023-01-18 LAB — PROCALCITONIN: Procalcitonin: 5.33 ng/mL

## 2023-01-18 LAB — CBC
HCT: 38.6 % — ABNORMAL LOW (ref 39.0–52.0)
Hemoglobin: 12.5 g/dL — ABNORMAL LOW (ref 13.0–17.0)
MCH: 32.2 pg (ref 26.0–34.0)
MCHC: 32.4 g/dL (ref 30.0–36.0)
MCV: 99.5 fL (ref 80.0–100.0)
Platelets: 225 10*3/uL (ref 150–400)
RBC: 3.88 MIL/uL — ABNORMAL LOW (ref 4.22–5.81)
RDW: 14.2 % (ref 11.5–15.5)
WBC: 13.7 10*3/uL — ABNORMAL HIGH (ref 4.0–10.5)
nRBC: 0 % (ref 0.0–0.2)

## 2023-01-18 LAB — URINALYSIS, W/ REFLEX TO CULTURE (INFECTION SUSPECTED)
Bacteria, UA: NONE SEEN
Bilirubin Urine: NEGATIVE
Glucose, UA: 50 mg/dL — AB
Hgb urine dipstick: NEGATIVE
Ketones, ur: NEGATIVE mg/dL
Leukocytes,Ua: NEGATIVE
Nitrite: NEGATIVE
Protein, ur: 30 mg/dL — AB
Specific Gravity, Urine: 1.018 (ref 1.005–1.030)
pH: 5 (ref 5.0–8.0)

## 2023-01-18 LAB — COMPREHENSIVE METABOLIC PANEL
ALT: 13 U/L (ref 0–44)
AST: 24 U/L (ref 15–41)
Albumin: 3.1 g/dL — ABNORMAL LOW (ref 3.5–5.0)
Alkaline Phosphatase: 57 U/L (ref 38–126)
Anion gap: 13 (ref 5–15)
BUN: 35 mg/dL — ABNORMAL HIGH (ref 8–23)
CO2: 20 mmol/L — ABNORMAL LOW (ref 22–32)
Calcium: 8.1 mg/dL — ABNORMAL LOW (ref 8.9–10.3)
Chloride: 107 mmol/L (ref 98–111)
Creatinine, Ser: 1.68 mg/dL — ABNORMAL HIGH (ref 0.61–1.24)
GFR, Estimated: 44 mL/min — ABNORMAL LOW (ref >60)
Glucose, Bld: 175 mg/dL — ABNORMAL HIGH (ref 70–99)
Potassium: 3.6 mmol/L (ref 3.5–5.1)
Sodium: 140 mmol/L (ref 135–145)
Total Bilirubin: 0.9 mg/dL (ref 0.3–1.2)
Total Protein: 6.6 g/dL (ref 6.5–8.1)

## 2023-01-18 LAB — LACTIC ACID, PLASMA
Lactic Acid, Venous: 3.3 mmol/L (ref 0.5–1.9)
Lactic Acid, Venous: 1.7 mmol/L (ref 0.5–1.9)

## 2023-01-18 MED ORDER — AZITHROMYCIN 250 MG PO TABS: 250.0000 mg | ORAL_TABLET | Freq: Every day | ORAL | 0 refills | Status: AC

## 2023-01-18 NOTE — Discharge Instructions (Signed)
Continue to take your antibiotic until complete.  Follow up with your PCP within 1 week to monitor your recovery

## 2023-01-18 NOTE — ED Notes (Signed)
Pt transported by me from ED03 to 253 on cardiac monitor.

## 2023-01-18 NOTE — Plan of Care (Signed)
  Problem: Education: Goal: Knowledge of risk factors and measures for prevention of condition will improve Outcome: Progressing   Problem: Coping: Goal: Psychosocial and spiritual needs will be supported Outcome: Progressing   Problem: Respiratory: Goal: Will maintain a patent airway Outcome: Progressing Goal: Complications related to the disease process, condition or treatment will be avoided or minimized Outcome: Progressing   

## 2023-01-18 NOTE — Plan of Care (Signed)

## 2023-01-18 NOTE — Care Management Important Message (Signed)
Important Message  Patient Details  Name: Matthew Peters MRN: [id number] Date of Birth: April 21, 1956   Medicare Important Message Given:  Yes     Colette Ribas, LCSWA 01/18/2023, 3:31 PM

## 2023-01-18 NOTE — Discharge Summary (Signed)
Physician Discharge Summary  Patient: Matthew Peters WGN:[id number] DOB: [birth year]   Code Status: Full Code Admit date: 01/17/2023 Discharge date: 01/18/2023 Disposition: Home, No home health services recommended PCP: Corky Downs, MD  Recommendations for Outpatient Follow-up:  Follow up with PCP within 1-2 weeks Regarding general hospital follow up and preventative care  Discharge Diagnoses:  Principal Problem:   Severe sepsis due to pneumonia [facility]) Active Problems:   Pneumonia due to COVID-19 virus   AKI (acute kidney injury) (HCC)   Hypotension   Squamous cell carcinoma of head and neck   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 male with PMHx SCC of the head and neck status post CCRT, hypothyroidism, CAD with STEMI March 2024 s/p stent to left circumflex for 95% lesion. They presented to the ED with a 1 week history of generalized weakness and decreased oral intake, now associated with a productive cough over the past couple days.  ED course: Afebrile on arrival but hypotensive to 92/65, tachycardic to 124 and tachypneic to 26 but maintaining sats in the high 90s on room air. Labs significant for WBC 13,000 with lactic acid 3.3.  Creatinine 1.62 up from baseline of 0.77 with mild metabolic acidosis with bicarb of 19.  COVID-positive.  Troponin 14. EKG: sinus tachycardia at 119 with nonspecific ST-T wave changes. Chest x-ray:left upper lobe airspace opacity favoring pneumonia Initial treatment included: 1750 mL fluid bolus started on Rocephin and azithromycin. Hospitalist consulted for admission.  Throughout his admission, overnight, patient remained stable ORA and had resolution of symptoms with supportive care. He was ambulated with pulse ox and maintained saturations >93% ORA.  He was discharged in stable condition with  the addition of a course of azithromycin to treat PNA.  All other chronic conditions were treated with home medications.   Discharge Condition: Good, improved Recommended discharge diet: Regular healthy diet  Consultations: None   Procedures/Studies: None   Allergies as of 01/18/2023       Reactions   Other Anaphylaxis   Sulfa Antibiotics Anaphylaxis        Medication List     TAKE these medications    acetaminophen 325 MG tablet Commonly known as: TYLENOL Take 650 mg by mouth every 4 (four) hours as needed.   aspirin EC 81 MG tablet Take 1 tablet (81 mg total) by mouth daily. Swallow whole.   atorvastatin 40 MG tablet Commonly known as: LIPITOR Take 1 tablet (40 mg total) by mouth daily.   azithromycin 250 MG tablet Commonly known as: ZITHROMAX Take 1 tablet (250 mg total) by mouth daily for 4 days.   levothyroxine 75 MCG tablet Commonly known as: SYNTHROID Take 1 tablet by mouth daily.   losartan 50 MG tablet Commonly known as: COZAAR Take 1 tablet (50 mg total) by mouth daily.   nitroGLYCERIN 0.4 MG SL tablet Commonly known as: NITROSTAT Place 1 tablet (0.4 mg total) under the tongue every 5 (five) minutes as needed for chest pain.   ticagrelor 90 MG Tabs tablet Commonly known as: BRILINTA Take 1 tablet (90 mg total) by mouth 2 (two) times daily.   Vitamin E/D-Alpha Natural 268 MG (400 UNIT) Caps Generic drug: Vitamin E Take 1 tablet by mouth daily.        Follow-up Information     Corky Downs, MD. Schedule an appointment as  soon as possible for a visit in 1 week(s).   Specialties: Internal Medicine, Cardiology Contact information: [address] [place] [address] [phone]                 Subjective   Pt reports feeling well. He has mild residual cough. Denies congestion, difficulty breathing, chest pain.   All questions and concerns were addressed at time of discharge.  Objective  Blood pressure (!) 120/59, pulse 70,  temperature 98.5 F (36.9 C), temperature source Oral, resp. rate 18, height 5\' 7"  (1.702 m), weight 65.6 kg, SpO2 95%.   General: Pt is alert, awake, not in acute distress Cardiovascular: RRR, S1/S2 +, no rubs, no gallops Respiratory: CTA bilaterally, no wheezing, no rhonchi Abdominal: Soft, NT, ND, bowel sounds + Extremities: no edema, no cyanosis  The results of significant diagnostics from this hospitalization (including imaging, microbiology, ancillary and laboratory) are listed below for reference.   Imaging studies: DG Chest 2 View  Result Date: 01/17/2023 CLINICAL DATA:  Altered level of consciousness, potential sepsis EXAM: CHEST - 2 VIEW COMPARISON:  08/08/2022 FINDINGS: Consolidation posteriorly in the left upper lobe favoring pneumonia, best appreciated on the lateral projection. The right lung appears clear. Cardiac and mediastinal margins appear normal. No blunting of the costophrenic angles. IMPRESSION: 1. Left upper lobe airspace opacity favoring pneumonia. Electronically Signed   By: Gaylyn Rong M.D.   On: 01/17/2023 18:39   VAS [place] ABI WITH/WO TBI  Result Date: 01/11/2023  LOWER EXTREMITY DOPPLER STUDY Patient Name:  Matthew MATTHEY  Date of Exam:   01/11/2023 Medical Rec #: [id number]      Accession #:    [id number] Date of Birth: 1955/09/02      Patient Gender: M Patient Age:   89 years Exam Location:  [place] Procedure:      VAS [place] ABI WITH/WO TBI Referring Phys: Nicolasa Ducking --------------------------------------------------------------------------------  Indications: Bilateral leg weakness, R=L. Noticed symptoms 5 months ago, since              MI. Weakness extends through entire legs High Risk Factors: Hyperlipidemia, prior MI, coronary artery disease.  Comparison Study: No previous Performing Technologist: Quentin Ore RVT  Examination Guidelines: A complete evaluation includes at minimum, Doppler waveform signals and systolic blood pressure reading at the level of  bilateral brachial, anterior tibial, and posterior tibial arteries, when vessel segments are accessible. Bilateral testing is considered an integral part of a complete examination. Photoelectric Plethysmograph (PPG) waveforms and toe systolic pressure readings are included as required and additional duplex testing as needed. Limited examinations for reoccurring indications may be performed as noted.  ABI Findings: +---------+------------------+-----+---------+--------+ Right    Rt Pressure (mmHg)IndexWaveform Comment  +---------+------------------+-----+---------+--------+ Brachial 144                    triphasic         +---------+------------------+-----+---------+--------+ PTA      143               0.99 triphasic         +---------+------------------+-----+---------+--------+ DP       143               0.99 triphasic         +---------+------------------+-----+---------+--------+ Great Toe129               0.90 Normal            +---------+------------------+-----+---------+--------+ +---------+------------------+-----+---------+-------+ Left     Lt Pressure (mmHg)IndexWaveform  Comment +---------+------------------+-----+---------+-------+ Brachial 141                    triphasic        +---------+------------------+-----+---------+-------+ PTA      146               1.01 triphasic        +---------+------------------+-----+---------+-------+ DP       158               1.10 triphasic        +---------+------------------+-----+---------+-------+ Great Toe132               0.92 Normal           +---------+------------------+-----+---------+-------+ +-------+-----------+-----------+------------+------------+ ABI/TBIToday's ABIToday's TBIPrevious ABIPrevious TBI +-------+-----------+-----------+------------+------------+ Right  0.99       0.9                                 +-------+-----------+-----------+------------+------------+ Left    1.1        0.92                                +-------+-----------+-----------+------------+------------+  Summary: Right: Resting right ankle-brachial index is within normal range. The right toe-brachial index is normal. Left: Resting left ankle-brachial index is within normal range. The left toe-brachial index is normal. *See table(s) above for measurements and observations.  Electronically signed by Dina Rich MD on 01/11/2023 at 5:42:55 PM.    Final     Labs: Basic Metabolic Panel: Recent Labs  Lab 01/17/23 1758 01/18/23 0118  NA 138 140  K 3.6 3.6  CL 108 107  CO2 19* 20*  GLUCOSE 161* 175*  BUN 33* 35*  CREATININE 1.62* 1.68*  CALCIUM 9.0 8.1*   CBC: Recent Labs  Lab 01/17/23 1758 01/18/23 0118  WBC 13.4* 13.7*  NEUTROABS 11.1*  --   HGB 13.8 12.5*  HCT 42.5 38.6*  MCV 98.4 99.5  PLT 243 225   Microbiology: Results for orders placed or performed during the hospital encounter of 01/17/23  Culture, blood (Routine x 2)     Status: None (Preliminary result)   Collection Time: 01/17/23  5:56 PM   Specimen: BLOOD  Result Value Ref Range Status   Specimen Description BLOOD BLOOD LEFT HAND  Final   Special Requests   Final    BOTTLES DRAWN AEROBIC AND ANAEROBIC Blood Culture adequate volume   Culture   Final    NO GROWTH 2 DAYS Performed at [facility], [address] [place], [place] [address]    Report Status PENDING  Incomplete  SARS Coronavirus 2 by RT PCR (hospital order, performed in [facility] hospital lab) *cepheid single result test* Anterior Nasal Swab     Status: Abnormal   Collection Time: 01/17/23  9:50 PM   Specimen: Anterior Nasal Swab  Result Value Ref Range Status   SARS Coronavirus 2 by RT PCR POSITIVE (A) NEGATIVE Final    Comment: (NOTE) SARS-CoV-2 target nucleic acids are DETECTED  SARS-CoV-2 RNA is generally detectable in upper respiratory specimens  during the acute phase of infection.  Positive results are indicative  of  the presence of the identified virus, but do not rule out bacterial infection or co-infection with other pathogens not detected by the test.  Clinical correlation with patient history and  other diagnostic information is necessary  to determine patient infection status.  The expected result is negative.  Fact Sheet for Patients:   RoadLapTop.co.za   Fact Sheet for Healthcare Providers:   http://kim-miller.com/    This test is not yet approved or cleared by the [place] FDA and  has been authorized for detection and/or diagnosis of SARS-CoV-2 by FDA under an Emergency Use Authorization (EUA).  This EUA will remain in effect (meaning this test can be used) for the duration of  the COVID-19 declaration under Section 564(b)(1)  of the Act, 21 U.S.C. section 360-bbb-3(b)(1), unless the authorization is terminated or revoked sooner.   Performed at [facility], [address] [place], [place] [address]   Culture, blood (Routine x 2)     Status: None (Preliminary result)   Collection Time: 01/18/23  1:18 AM   Specimen: BLOOD  Result Value Ref Range Status   Specimen Description BLOOD BLOOD RIGHT ARM  Final   Special Requests   Final    BOTTLES DRAWN AEROBIC AND ANAEROBIC Blood Culture adequate volume   Culture   Final    NO GROWTH 1 DAY Performed at [facility], [address], [place], [place] [address]    Report Status PENDING  Incomplete   Time coordinating discharge: Over 30 minutes  Leeroy Bock, MD  [facility] 01/18/2023, 3:20 PM

## 2023-01-22 LAB — CULTURE, BLOOD (ROUTINE X 2)
Culture: NO GROWTH
Special Requests: ADEQUATE

## 2023-01-23 LAB — CULTURE, BLOOD (ROUTINE X 2)
Culture: NO GROWTH
Special Requests: ADEQUATE

## 2023-02-20 IMAGING — US US CAROTID DUPLEX BILAT
1 series · 13 of 24 positions shown · non-contrast
Comparison: None.

CLINICAL DATA: 65-year-old male with carotid bruit

EXAM:
BILATERAL CAROTID DUPLEX ULTRASOUND
TECHNIQUE: Gray scale imaging, color Doppler and duplex ultrasound were
performed of bilateral carotid and vertebral arteries in the neck.

[Series 1: us carotid bilateral · 13 of 64 slices shown]
[im 1/64]
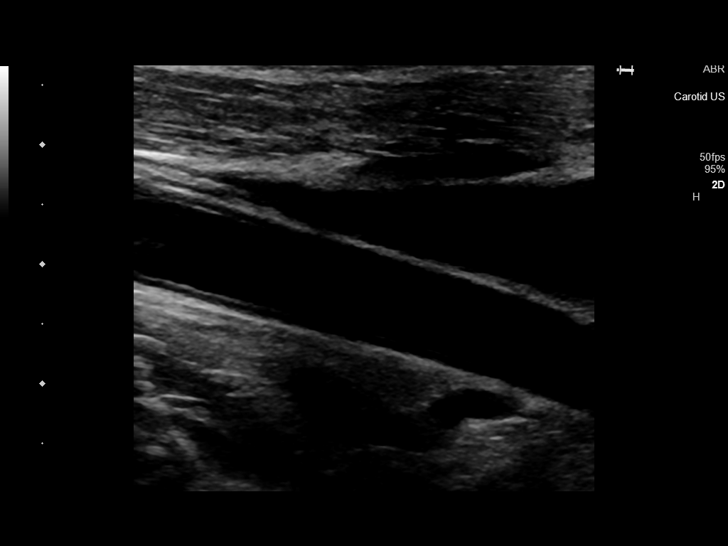
[im 6/64]
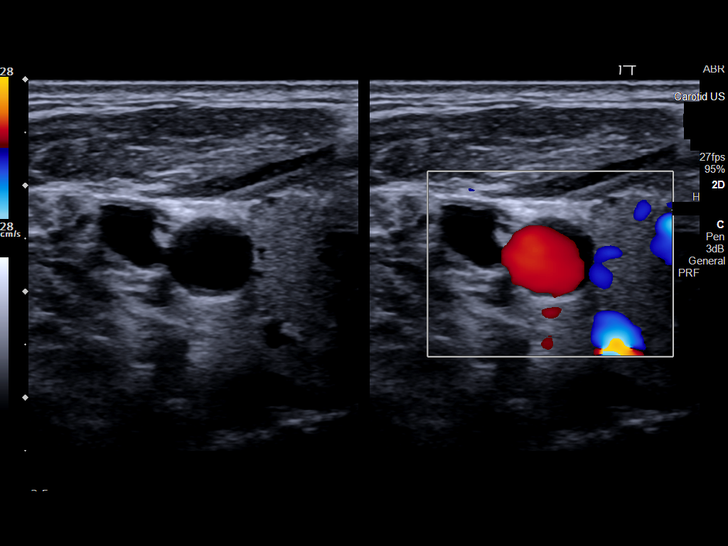
[im 11/64]
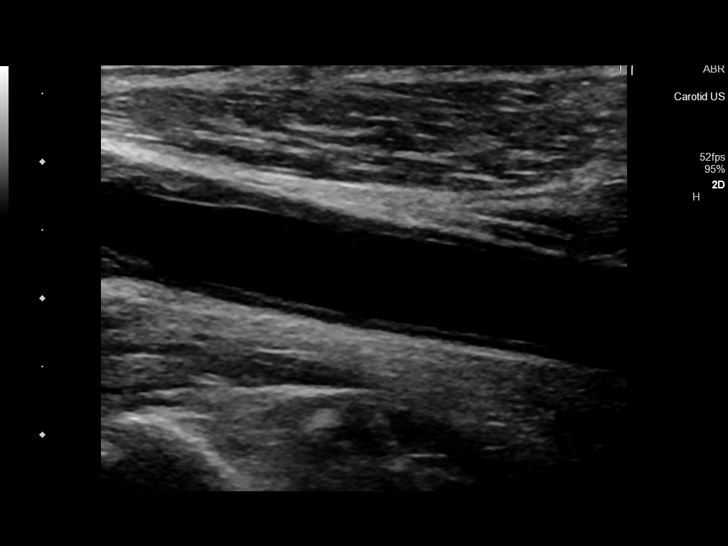
[im 17/64]
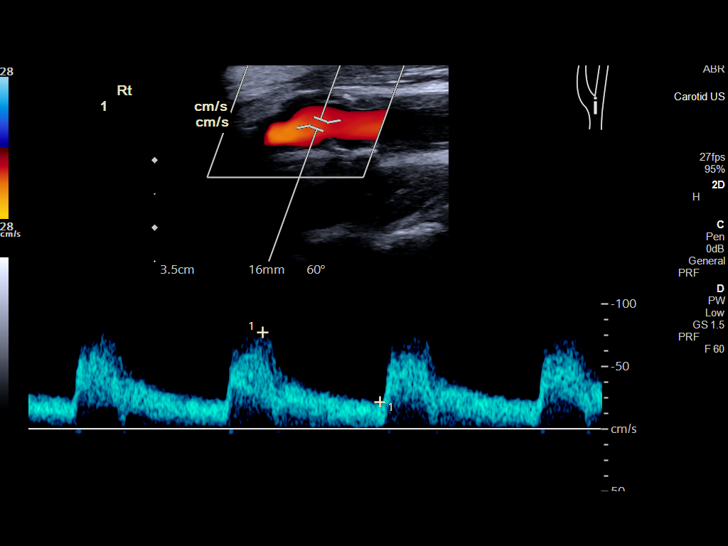
[im 22/64]
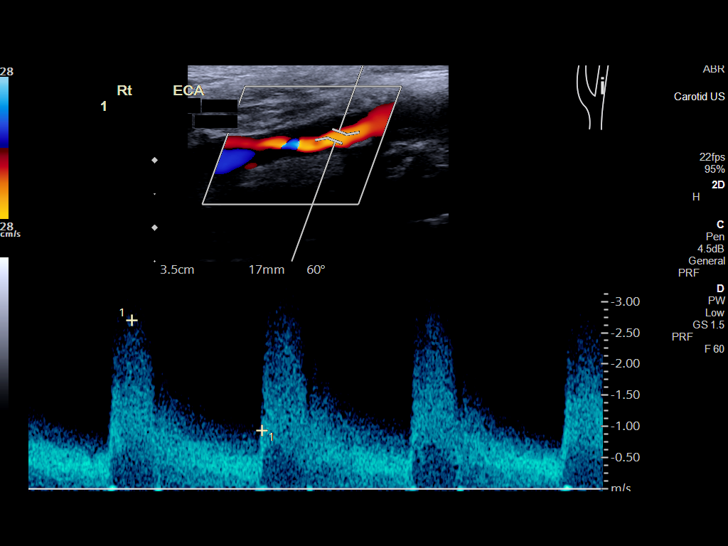
[im 28/64]
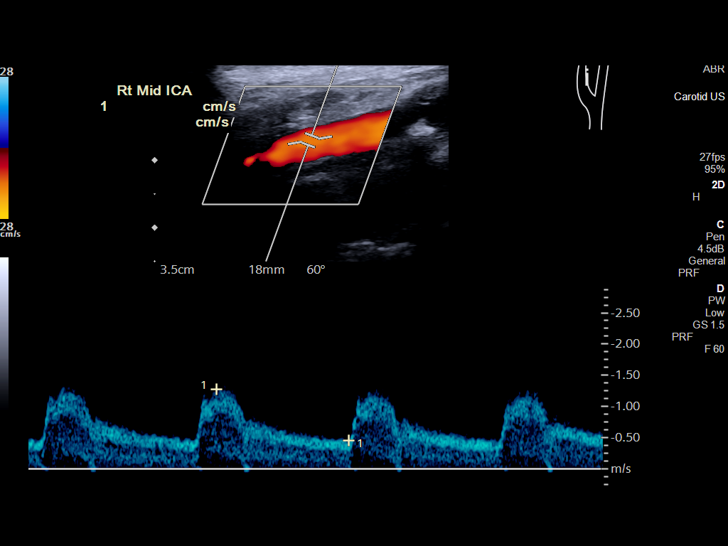
[im 33/64]
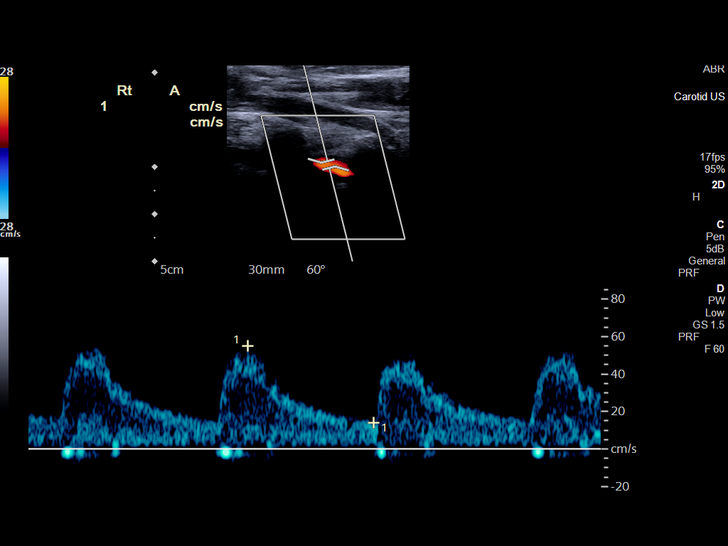
[im 36/64]
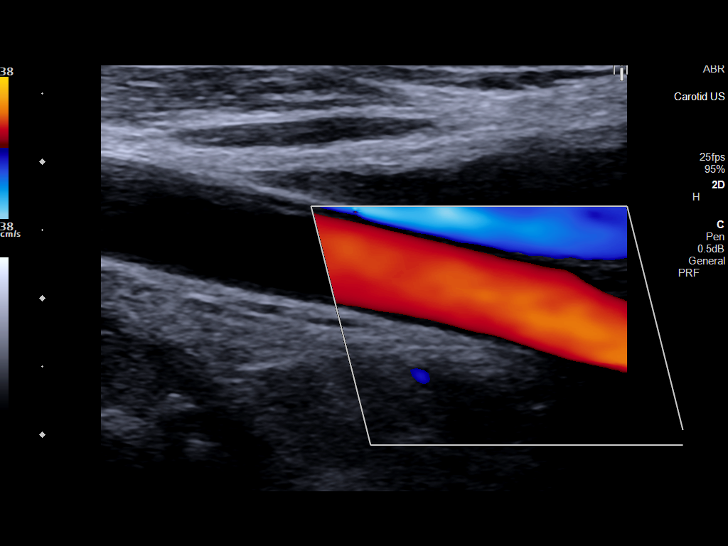
[im 42/64]
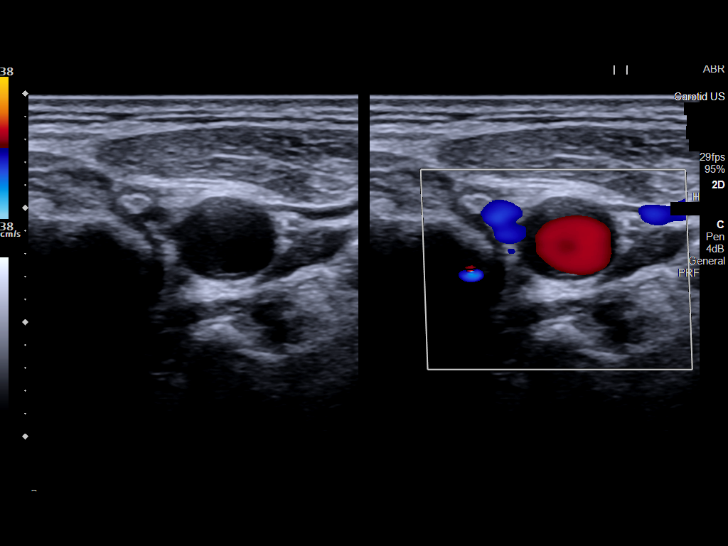
[im 47/64]
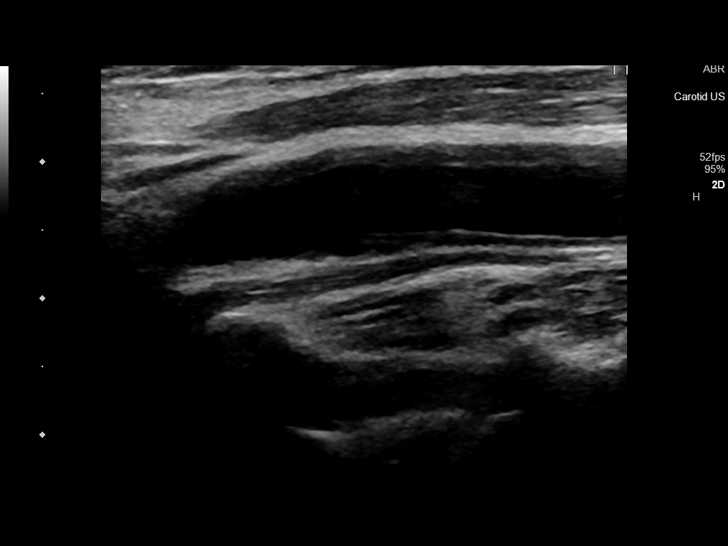
[im 53/64]
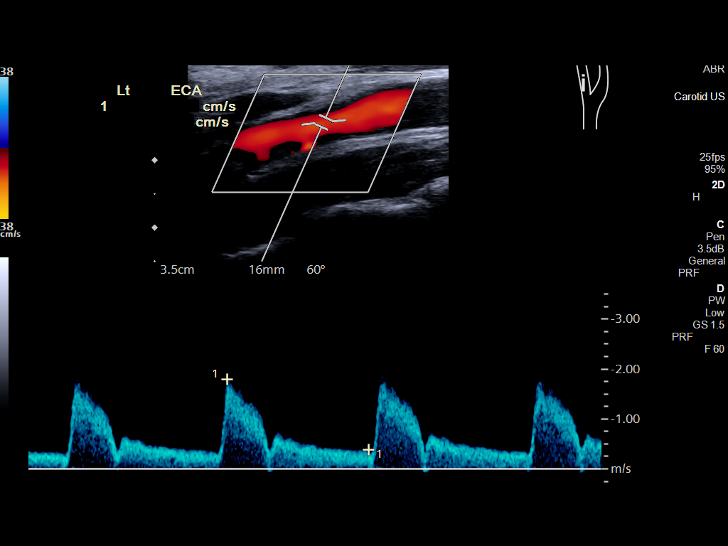
[im 58/64]
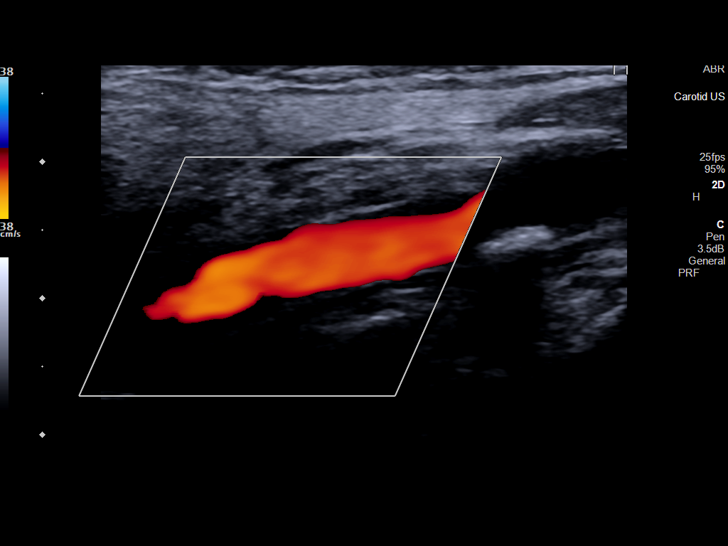
[im 64/64]
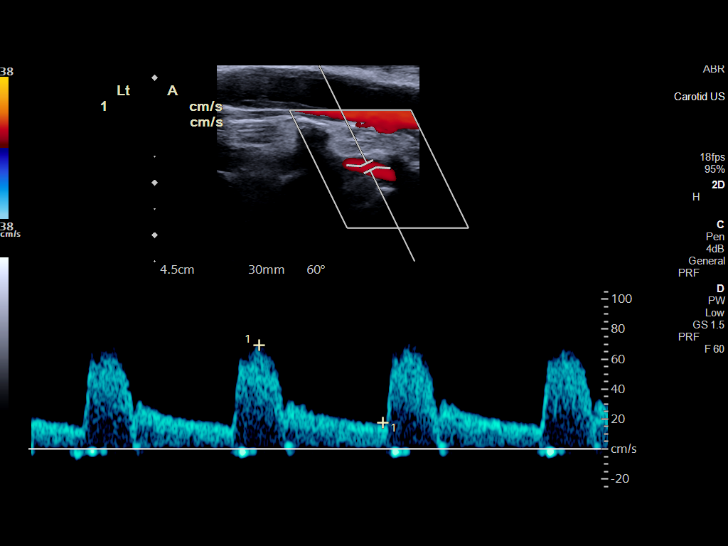

[13 of 24 positions shown; findings below may reference images not displayed]

FINDINGS: Criteria: Quantification of carotid stenosis is based on velocity
parameters that correlate the residual internal carotid diameter
with NASCET-based stenosis levels, using the diameter of the distal
internal carotid lumen as the denominator for stenosis measurement.

The following velocity measurements were obtained:

RIGHT

ICA:  Systolic 129 cm/sec, Diastolic 48 cm/sec

CCA:  90 cm/sec

SYSTOLIC ICA/CCA RATIO:

ECA:  270 cm/sec

LEFT

ICA:  Systolic 122 cm/sec, Diastolic 49 cm/sec

CCA:  119 cm/sec

SYSTOLIC ICA/CCA RATIO:

ECA:  179 cm/sec

Right Brachial SBP: Not acquired

Left Brachial SBP: Not acquired

RIGHT CAROTID ARTERY: No significant calcified disease of the right
common carotid artery. Intermediate waveform maintained.
Heterogeneous plaque without significant calcifications at the right
carotid bifurcation. Low resistance waveform of the right ICA. No
significant tortuosity.

RIGHT VERTEBRAL ARTERY: Antegrade flow with low resistance waveform.

LEFT CAROTID ARTERY: No significant calcified disease of the left
common carotid artery. Intermediate waveform maintained.
Heterogeneous plaque at the left carotid bifurcation without
significant calcifications. Low resistance waveform of the left ICA.

LEFT VERTEBRAL ARTERY:  Antegrade flow with low resistance waveform.
IMPRESSION: Color duplex indicates minimal heterogeneous plaque, with no
hemodynamically significant stenosis by duplex criteria in the
extracranial cerebrovascular circulation.

## 2023-02-28 ENCOUNTER — Ambulatory Visit: Payer: Medicare Other | Attending: Nurse Practitioner | Admitting: Cardiovascular Disease

## 2023-02-28 ENCOUNTER — Encounter: Payer: Self-pay | Admitting: Cardiovascular Disease

## 2023-02-28 VITALS — BP 140/80 | HR 77 | Ht 67.0 in | Wt 147.1 lb

## 2023-02-28 DIAGNOSIS — E785 Hyperlipidemia, unspecified: Secondary | ICD-10-CM

## 2023-02-28 DIAGNOSIS — I214 Non-ST elevation (NSTEMI) myocardial infarction: Secondary | ICD-10-CM

## 2023-02-28 DIAGNOSIS — I10 Essential (primary) hypertension: Secondary | ICD-10-CM

## 2023-02-28 DIAGNOSIS — I25118 Atherosclerotic heart disease of native coronary artery with other forms of angina pectoris: Secondary | ICD-10-CM | POA: Diagnosis not present

## 2023-02-28 NOTE — Progress Notes (Signed)
Cardiology Office Note  Date:  02/28/2023   ID:  Matthew Peters, DOB 03/05/1956, MRN [id number]  PCP:  Corky Downs, MD   Chief Complaint  Patient presents with   1 month follow up     "Doing well." Medications reviewed by the patient verbally.     HPI:  67 y.o. male with a history of  oral cancer status post chemoradiation complicated by dysphagia and failure to thrive with placement of G-tube in January 2023,  hypothyroidism,  CAD status post non-STEMI, March 2024, stent x 2 to the left circumflex Chronic total occlusion of the RCA, moderate nonobstructive disease otherwise hyperlipidemia,  History of smoking, minimal smoking who presents for f/u of his CAD, prior stenting  Last seen in clinic by one of our providers January 10, 2023 At that time reported leg weakness on ambulation Normal ABIs   In the hospital January 17, 2023 Weakness, hypotensive on arrival with tachycardia rate 120s WBC 13 diagnosed with sepsis due to pneumonia, COVID-19 Acute renal failure Treated with IV fluids, broad-spectrum antibiotics Creatinine 1.68, above baseline 0.9  Today feels well, slowly getting back to normal Breathing better,  nose congestion when sitting up, better when laying down  On discussion of his blood pressure medications, Losartan 100 milligram was making him dizzy Went back to losartan 50 daily, denies dizziness/orthostasis symptoms Periodic rechecks blood pressure at home maybe once a week, reports readings 128 systolic  Denies chest pain concerning for angina No shortness of breath on exertion No regular exercise program  Lab work reviewed Total cholesterol 138 LDL 62 A1c 5.6    CAD (coronary artery disease)       a. 08/2022 NSTEMI/PCI: LM short/nl, LAD 32m, 65d, D2 90, LCX 95p/m (2.75x26 Onyx Frontier DES), LCX 80m, 81m/d (2.5x8 Onyx Frontier DES), OM3 20, RCA small, 100p/m CTO, RPDA fills via L->R collats from Sept 1/2, RPAV fills via L->R collats from dLCX.     PMH:   has a past medical history of CAD (coronary artery disease), Failure to thrive in adult, History of echocardiogram, Hyperlipidemia LDL goal <70, Hypothyroidism, and Squamous cell carcinoma of trachea (HCC).  PSH:    Past Surgical History:  Procedure Laterality Date   CORONARY STENT INTERVENTION N/A 08/09/2022   Procedure: CORONARY STENT INTERVENTION;  Surgeon: Yvonne Kendall, MD;  Location: [facility] INVASIVE CV LAB;  Service: Cardiovascular;  Laterality: N/A;   GASTROSTOMY TUBE PLACEMENT     LEFT HEART CATH AND CORONARY ANGIOGRAPHY N/A 08/09/2022   Procedure: LEFT HEART CATH AND CORONARY ANGIOGRAPHY;  Surgeon: Yvonne Kendall, MD;  Location: [facility] INVASIVE CV LAB;  Service: Cardiovascular;  Laterality: N/A;    Current Outpatient Medications  Medication Sig Dispense Refill   acetaminophen (TYLENOL) 325 MG tablet Take 650 mg by mouth every 4 (four) hours as needed.     aspirin EC 81 MG tablet Take 1 tablet (81 mg total) by mouth daily. Swallow whole. 30 tablet 12   atorvastatin (LIPITOR) 40 MG tablet Take 1 tablet (40 mg total) by mouth daily. 90 tablet 3   levothyroxine (SYNTHROID) 75 MCG tablet Take 1 tablet by mouth daily.     losartan (COZAAR) 50 MG tablet Take 1 tablet (50 mg total) by mouth daily. 90 tablet 3   nitroGLYCERIN (NITROSTAT) 0.4 MG SL tablet Place 1 tablet (0.4 mg total) under the tongue every 5 (five) minutes as needed for chest pain. 25 tablet 1   ticagrelor (BRILINTA) 90 MG TABS tablet Take 1 tablet (90 mg  total) by mouth 2 (two) times daily. 60 tablet 11   No current facility-administered medications for this visit.     Allergies:   Other and Sulfa antibiotics   Social History:  The patient  reports that he quit smoking about 8 months ago. His smoking use included cigarettes. He started smoking about 50 years ago. He has a 75 pack-year smoking history. He has never used smokeless tobacco. He reports that he does not drink alcohol and does not use drugs.    Family History:   family history includes Heart disease in his mother.    Review of Systems: Review of Systems  Constitutional: Negative.   HENT:  Positive for congestion.   Respiratory: Negative.    Cardiovascular: Negative.   Gastrointestinal: Negative.   Musculoskeletal: Negative.   Neurological: Negative.   Psychiatric/Behavioral: Negative.    All other systems reviewed and are negative.    PHYSICAL EXAM: VS:  BP (!) 150/86 (BP Location: Left Arm, Patient Position: Sitting, Cuff Size: Normal)   Pulse 77   Ht 5\' 7"  (1.702 m)   Wt 147 lb 2 oz (66.7 kg)   SpO2 97%   BMI 23.04 kg/m  , BMI Body mass index is 23.04 kg/m. GEN: Well nourished, well developed, in no acute distress HEENT: normal Neck: no JVD, carotid bruits, or masses Cardiac: RRR; no murmurs, rubs, or gallops,no edema  Respiratory:  clear to auscultation bilaterally, normal work of breathing GI: soft, nontender, nondistended, + BS MS: no deformity or atrophy Skin: warm and dry, no rash Neuro:  Strength and sensation are intact Psych: euthymic mood, full affect   Recent Labs: 08/08/2022: TSH 1.900 01/18/2023: ALT 13; BUN 35; Creatinine, Ser 1.68; Hemoglobin 12.5; Platelets 225; Potassium 3.6; Sodium 140    Lipid Panel Lab Results  Component Value Date   CHOL 138 09/27/2022   HDL 61 09/27/2022   LDLCALC 62 09/27/2022   TRIG 75 09/27/2022      Wt Readings from Last 3 Encounters:  02/28/23 147 lb 2 oz (66.7 kg)  01/18/23 144 lb 10 oz (65.6 kg)  01/10/23 147 lb 8 oz (66.9 kg)      ASSESSMENT AND PLAN:  Problem List Items Addressed This Visit   None Visit Diagnoses     Coronary artery disease of native artery of native heart with stable angina pectoris (HCC)    -  Primary   Hyperlipidemia LDL goal <70       Essential hypertension       Non-ST elevation myocardial infarction (NSTEMI), subsequent episode of care [facility])          Coronary artery disease with stable angina Currently with no  symptoms of angina. No further workup at this time. Continue current medication regimen.  Hyperlipidemia Cholesterol at goal on current regiment, Lipitor 40 daily  Essential hypertension Mildly elevated on today's visit, improved on recheck Recommend he continue losartan 50 daily, monitor blood pressure at home Reports he had orthostasis symptoms on losartan 100 daily  Acute renal failure Creatinine up to 1.6 on recent hospital admission in the setting of COVID-pneumonia, felt to be dehydration by hospitalist service No significant improvement on day 2 of hospitalization Unable to exclude ATN in the setting of hypotension on arrival to the emergency room We have ordered repeat BMP For worsening renal function, may need to reconsider losartan Could also consider renal artery ultrasound  Recommend he follow-up with Dr. Okey Dupre or APP  in 6 months  Total encounter time more  than 30 minutes  Greater than 50% was spent in counseling and coordination of care with the patient    Signed, Dossie Arbour, M.D., Ph.D. [facility] [place], [place] [phone]

## 2023-02-28 NOTE — Patient Instructions (Addendum)
Medication Instructions:  No changes  If you need a refill on your cardiac medications before your next appointment, please call your pharmacy.   Lab work: [employer] today  Testing/Procedures: No new testing needed  Follow-Up: At [facility], you and your health needs are our priority.  As part of our continuing mission to provide you with exceptional heart care, we have created designated Provider Care Teams.  These Care Teams include your primary Cardiologist (physician) and Advanced Practice Providers (APPs -  Physician Assistants and Nurse Practitioners) who all work together to provide you with the care you need, when you need it.  You will need a follow up appointment in 6 months Dr. Okey Dupre  Providers on your designated Care Team:   Nicolasa Ducking, NP Eula Listen, PA-C Cadence Fransico Michael, [place]  COVID-19 Vaccine Information can be found at: [email or web address] For questions related to vaccine distribution or appointments, please email [email or web address] or call [phone].

## 2023-03-01 LAB — BASIC METABOLIC PANEL
BUN/Creatinine Ratio: 19 (ref 10–24)
BUN: 20 mg/dL (ref 8–27)
CO2: 21 mmol/L (ref 20–29)
Calcium: 9.4 mg/dL (ref 8.6–10.2)
Chloride: 105 mmol/L (ref 96–106)
Creatinine, Ser: 1.04 mg/dL (ref 0.76–1.27)
Glucose: 83 mg/dL (ref 70–99)
Potassium: 4.3 mmol/L (ref 3.5–5.2)
Sodium: 144 mmol/L (ref 134–144)
eGFR: 78 mL/min/{1.73_m2} (ref >59)

## 2023-03-13 ENCOUNTER — Encounter: Payer: Self-pay | Admitting: Emergency Medicine

## 2023-06-12 ENCOUNTER — Telehealth: Payer: Self-pay | Admitting: Cardiovascular Disease

## 2023-06-12 NOTE — Telephone Encounter (Signed)
Attempted to reach the patient. Was unable to leave a message 

## 2023-06-12 NOTE — Telephone Encounter (Signed)
 Pt returning call

## 2023-06-12 NOTE — Telephone Encounter (Signed)
No answer/No voicemail box has been set up. 

## 2023-06-12 NOTE — Telephone Encounter (Signed)
 Pt c/o medication issue:  1. Name of Medication:   ticagrelor  (BRILINTA ) 90 MG TABS tablet   2. How are you currently taking this medication (dosage and times per day)?   As prescribed  3. Are you having a reaction (difficulty breathing--STAT)?   No  4. What is your medication issue?   Patient stated this medication is too expensive and wants to know if he still needs this medication and wants to get less expensive medication.

## 2023-06-14 NOTE — Telephone Encounter (Signed)
 Attempted to reach the patient. Was unable to leave a message and there was not a voicemail.

## 2023-07-24 ENCOUNTER — Encounter: Payer: Self-pay | Admitting: *Deleted

## 2023-08-20 NOTE — Progress Notes (Unsigned)
  Cardiology Office Note:  .   Date:  08/22/2023  ID:  Matthew Peters, DOB [birth year], MRN [id number] PCP: Corky Downs, MD  [facility] Providers Cardiologist:  Yvonne Kendall, MD { Click to update primary MD,subspecialty MD or APP then REFRESH:1}    History of Present Illness: .   Matthew Peters is a 68 y.o. male with history of CAD s/p PCI to LCx with overlapping DES x 2 as well as moderate LAD disease and CTO of RCA, hyperlipidemia, and trachial cancer, who presents for follow-up of CAD.  He was last seen in 02/2023 by Dr. Mariah Milling, at which time he was feeling well from a heart standpoint.  No medication changes or additional testing were pursued.  Occ dizziness, more vertigo like.  No falls.  Due for ENT f/u next.  No CP or shortness of breath.  No edema or palpitations.  No bleeding.  Ran out of   ROS: See HPI  Studies Reviewed: Marland Kitchen   EKG Interpretation Date/Time:  Wednesday August 22 2023 11:22:46 EDT Ventricular Rate:  58 PR Interval:  134 QRS Duration:  76 QT Interval:  422 QTC Calculation: 414 R Axis:   66  Text Interpretation: Sinus bradycardia Otherwise normal ECG When compared with ECG of 17-Jan-2023 21:31, HEART RATE has decreased Confirmed by Camree Wigington, Cristal Deer [phone]) on 08/22/2023 11:24:06 AM    *** Risk Assessment/Calculations:   {Does this patient have ATRIAL FIBRILLATION?:[id number]}         Physical Exam:   VS:  BP 130/72 (BP Location: Left Arm, Patient Position: Sitting)   Pulse (!) 58   Ht 5\' 7"  (1.702 m)   Wt 143 lb 9.6 oz (65.1 kg)   SpO2 96%   BMI 22.49 kg/m    Wt Readings from Last 3 Encounters:  08/22/23 143 lb 9.6 oz (65.1 kg)  02/28/23 147 lb 2 oz (66.7 kg)  01/18/23 144 lb 10 oz (65.6 kg)    General:  NAD. Neck: No JVD or HJR. Lungs: Clear to auscultation bilaterally without wheezes or crackles. Heart: Regular rate and rhythm without murmurs, rubs, or gallops. Abdomen: Soft, nontender, nondistended. Extremities: No lower extremity  edema.  ASSESSMENT AND PLAN: .    ***    {Are you ordering a CV Procedure (e.g. stress test, cath, DCCV, TEE, etc)?   Press F2        :[id number]}  Dispo: ***  Signed, Yvonne Kendall, MD

## 2023-08-22 ENCOUNTER — Encounter: Payer: Self-pay | Admitting: Internal Medicine

## 2023-08-22 ENCOUNTER — Ambulatory Visit: Payer: Medicare Other | Attending: Internal Medicine | Admitting: Internal Medicine

## 2023-08-22 VITALS — BP 130/72 | HR 58 | Ht 67.0 in | Wt 143.6 lb

## 2023-08-22 DIAGNOSIS — I25118 Atherosclerotic heart disease of native coronary artery with other forms of angina pectoris: Secondary | ICD-10-CM

## 2023-08-22 DIAGNOSIS — I2510 Atherosclerotic heart disease of native coronary artery without angina pectoris: Secondary | ICD-10-CM

## 2023-08-22 DIAGNOSIS — I10 Essential (primary) hypertension: Secondary | ICD-10-CM | POA: Diagnosis not present

## 2023-08-22 DIAGNOSIS — E785 Hyperlipidemia, unspecified: Secondary | ICD-10-CM | POA: Diagnosis not present

## 2023-08-22 DIAGNOSIS — Z79899 Other long term (current) drug therapy: Secondary | ICD-10-CM | POA: Diagnosis not present

## 2023-08-22 DIAGNOSIS — I214 Non-ST elevation (NSTEMI) myocardial infarction: Secondary | ICD-10-CM

## 2023-08-22 MED ORDER — LOSARTAN POTASSIUM 25 MG PO TABS: 25.0000 mg | ORAL_TABLET | Freq: Every day | ORAL | 3 refills | Status: AC

## 2023-08-22 MED ORDER — NITROGLYCERIN 0.4 MG SL SUBL: 0.4000 mg | SUBLINGUAL_TABLET | SUBLINGUAL | 3 refills | Status: AC | PRN

## 2023-08-22 NOTE — Patient Instructions (Signed)
 Medication Instructions:  Your physician recommends the following medication changes.  STOP TAKING: Brilinta   DECREASE: Losartan to 25 mg by mouth daily  *If you need a refill on your cardiac medications before your next appointment, please call your pharmacy*   Lab Work: Your provider would like for you to return in 2 weeks to have the following labs drawn: (BMP, Lipid).   Please go to [facility] [address] ([facility] [address], [place] [address] You do not need an appointment.  They are open from 8 am- 4:30 pm.  Lunch from 1:00 pm- 2:00 pm You will need to be fasting.     Testing/Procedures: No test ordered today    Follow-Up: At [facility], you and your health needs are our priority.  As part of our continuing mission to provide you with exceptional heart care, we have created designated Provider Care Teams.  These Care Teams include your primary Cardiologist (physician) and Advanced Practice Providers (APPs -  Physician Assistants and Nurse Practitioners) who all work together to provide you with the care you need, when you need it.  We recommend signing up for the patient portal called "MyChart".  Sign up information is provided on this After Visit Summary.  MyChart is used to connect with patients for Virtual Visits (Telemedicine).  Patients are able to view lab/test results, encounter notes, upcoming appointments, etc.  Non-urgent messages can be sent to your provider as well.   To learn more about what you can do with MyChart, go to [email or web address].    Your next appointment:   6 month(s)  Provider:   You may see Yvonne Kendall, MD or one of the following Advanced Practice Providers on your designated Care Team:   Nicolasa Ducking, NP Eula Listen, PA-C Cadence Fransico Michael, PA-C Charlsie Quest, NP Carlos Levering, NP

## 2023-08-23 ENCOUNTER — Encounter: Payer: Self-pay | Admitting: Internal Medicine

## 2023-08-23 DIAGNOSIS — I10 Essential (primary) hypertension: Secondary | ICD-10-CM | POA: Insufficient documentation

## 2023-09-11 ENCOUNTER — Encounter: Payer: Self-pay | Admitting: Internal Medicine

## 2023-09-11 LAB — BASIC METABOLIC PANEL WITH GFR
BUN/Creatinine Ratio: 23 (ref 10–24)
BUN: 24 mg/dL (ref 8–27)
CO2: 22 mmol/L (ref 20–29)
Calcium: 9.4 mg/dL (ref 8.6–10.2)
Chloride: 105 mmol/L (ref 96–106)
Creatinine, Ser: 1.03 mg/dL (ref 0.76–1.27)
Glucose: 103 mg/dL — ABNORMAL HIGH (ref 70–99)
Potassium: 4.5 mmol/L (ref 3.5–5.2)
Sodium: 142 mmol/L (ref 134–144)
eGFR: 79 mL/min/{1.73_m2} (ref >59)

## 2023-09-11 LAB — LIPID PANEL
Chol/HDL Ratio: 2.4 ratio (ref 0.0–5.0)
Cholesterol, Total: 123 mg/dL (ref 100–199)
HDL: 52 mg/dL (ref >39)
LDL Chol Calc (NIH): 57 mg/dL (ref 0–99)
Triglycerides: 69 mg/dL (ref 0–149)
VLDL Cholesterol Cal: 14 mg/dL (ref 5–40)

## 2023-09-27 ENCOUNTER — Telehealth: Payer: Self-pay | Admitting: Internal Medicine

## 2023-09-27 NOTE — Telephone Encounter (Signed)
 Pt called back requesting to speak with me regarding recent blood pressures. I asked him what his symptoms were and he voiced that he has been having a headache. I explained to him that with a blood pressure of 190/90, a headache was an expected symptom. I also explained that per DOD, he needs to go ahead and take another losartan  25 mg and in 2 hours to take his blood pressure again. Even with the other blood pressure administration if his BP gets above 200/100 or if he starts having vision changes, passes out, or has a headache unrelieved with medication then he should go ahead to the ER or call 911. Pt verbalized understanding of information given. I told him to call back if he had anymore concerns and that we would check back tomorrow to see how his blood pressure responded to the medication. If he was still hypertensive I explained to him that we will get him in to see a doctor at that point for medication management. Pt was okay with this and verbalized being thankful for this.

## 2023-09-27 NOTE — Telephone Encounter (Signed)
 Pt c/o BP issue: STAT if pt c/o blurred vision, one-sided weakness or slurred speech.  STAT if BP is GREATER than 180/120 TODAY.  STAT if BP is LESS than 90/60 and SYMPTOMATIC TODAY  1. What is your BP concern? Too high  2. Have you taken any BP medication today? no   3. What are your last 5 BP readings?158/91, 172/92, 161/88, 152/95,158/97,175/97  4. Are you having any other symptoms (ex. Dizziness, headache, blurred vision, passed out)? headache

## 2023-09-27 NOTE — Telephone Encounter (Signed)
 Pt called. No VM box has been set up yet.

## 2024-02-21 ENCOUNTER — Other Ambulatory Visit: Payer: Self-pay | Admitting: Nurse Practitioner
